# Patient Record
Sex: Female | Born: 1955 | Race: White | Hispanic: No | Smoking: Former smoker
Health system: Southern US, Community
[De-identification: ages and names within clinical notes are randomized; demographics above are authoritative.]

## PROBLEM LIST (undated history)

## (undated) DIAGNOSIS — K219 Gastro-esophageal reflux disease without esophagitis: Secondary | ICD-10-CM

## (undated) DIAGNOSIS — K589 Irritable bowel syndrome without diarrhea: Secondary | ICD-10-CM

## (undated) DIAGNOSIS — F411 Generalized anxiety disorder: Secondary | ICD-10-CM

## (undated) DIAGNOSIS — K5900 Constipation, unspecified: Secondary | ICD-10-CM

## (undated) DIAGNOSIS — I2510 Atherosclerotic heart disease of native coronary artery without angina pectoris: Secondary | ICD-10-CM

## (undated) DIAGNOSIS — IMO0002 Reserved for concepts with insufficient information to code with codable children: Secondary | ICD-10-CM

## (undated) DIAGNOSIS — R634 Abnormal weight loss: Secondary | ICD-10-CM

## (undated) DIAGNOSIS — N951 Menopausal and female climacteric states: Secondary | ICD-10-CM

## (undated) DIAGNOSIS — K635 Polyp of colon: Secondary | ICD-10-CM

## (undated) DIAGNOSIS — M159 Polyosteoarthritis, unspecified: Secondary | ICD-10-CM

## (undated) DIAGNOSIS — N318 Other neuromuscular dysfunction of bladder: Secondary | ICD-10-CM

## (undated) DIAGNOSIS — F329 Major depressive disorder, single episode, unspecified: Secondary | ICD-10-CM

## (undated) DIAGNOSIS — J449 Chronic obstructive pulmonary disease, unspecified: Secondary | ICD-10-CM

## (undated) DIAGNOSIS — F3289 Other specified depressive episodes: Secondary | ICD-10-CM

## (undated) DIAGNOSIS — R110 Nausea: Secondary | ICD-10-CM

## (undated) DIAGNOSIS — J189 Pneumonia, unspecified organism: Secondary | ICD-10-CM

## (undated) DIAGNOSIS — J4489 Other specified chronic obstructive pulmonary disease: Secondary | ICD-10-CM

## (undated) DIAGNOSIS — M1990 Unspecified osteoarthritis, unspecified site: Secondary | ICD-10-CM

## (undated) DIAGNOSIS — M329 Systemic lupus erythematosus, unspecified: Secondary | ICD-10-CM

## (undated) DIAGNOSIS — R609 Edema, unspecified: Secondary | ICD-10-CM

## (undated) DIAGNOSIS — M797 Fibromyalgia: Secondary | ICD-10-CM

## (undated) DIAGNOSIS — J45909 Unspecified asthma, uncomplicated: Secondary | ICD-10-CM

## (undated) DIAGNOSIS — G4700 Insomnia, unspecified: Secondary | ICD-10-CM

## (undated) HISTORY — DX: Irritable bowel syndrome, unspecified: K58.9

## (undated) HISTORY — DX: Other specified chronic obstructive pulmonary disease: J44.89

## (undated) HISTORY — PX: CEREBRAL ANEURYSM REPAIR: SHX164

## (undated) HISTORY — DX: Polyp of colon: K63.5

## (undated) HISTORY — DX: Atherosclerotic heart disease of native coronary artery without angina pectoris: I25.10

## (undated) HISTORY — DX: Insomnia, unspecified: G47.00

## (undated) HISTORY — DX: Unspecified asthma, uncomplicated: J45.909

## (undated) HISTORY — DX: Abnormal weight loss: R63.4

## (undated) HISTORY — DX: Edema, unspecified: R60.9

## (undated) HISTORY — DX: Pneumonia, unspecified organism: J18.9

## (undated) HISTORY — DX: Chronic obstructive pulmonary disease, unspecified: J44.9

## (undated) HISTORY — DX: Major depressive disorder, single episode, unspecified: F32.9

## (undated) HISTORY — DX: Fibromyalgia: M79.7

## (undated) HISTORY — DX: Menopausal and female climacteric states: N95.1

## (undated) HISTORY — PX: BREAST LUMPECTOMY: SHX2

## (undated) HISTORY — DX: Polyosteoarthritis, unspecified: M15.9

## (undated) HISTORY — DX: Reserved for concepts with insufficient information to code with codable children: IMO0002

## (undated) HISTORY — PX: LUMBAR DISC SURGERY: SHX700

## (undated) HISTORY — DX: Gastro-esophageal reflux disease without esophagitis: K21.9

## (undated) HISTORY — DX: Unspecified osteoarthritis, unspecified site: M19.90

## (undated) HISTORY — DX: Systemic lupus erythematosus, unspecified: M32.9

## (undated) HISTORY — DX: Other specified depressive episodes: F32.89

## (undated) HISTORY — DX: Other neuromuscular dysfunction of bladder: N31.8

## (undated) HISTORY — DX: Generalized anxiety disorder: F41.1

## (undated) HISTORY — DX: Constipation, unspecified: K59.00

## (undated) HISTORY — DX: Nausea: R11.0

---
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
## 2001-02-27 ENCOUNTER — Inpatient Hospital Stay (HOSPITAL_COMMUNITY): Admission: EM | Admit: 2001-02-27 | Discharge: 2001-03-12 | Payer: Self-pay | Admitting: *Deleted

## 2001-03-07 ENCOUNTER — Encounter: Payer: Self-pay | Admitting: Emergency Medicine

## 2001-03-08 ENCOUNTER — Encounter: Payer: Self-pay | Admitting: *Deleted

## 2001-06-17 ENCOUNTER — Inpatient Hospital Stay (HOSPITAL_COMMUNITY): Admission: EM | Admit: 2001-06-17 | Discharge: 2001-06-20 | Payer: Self-pay | Admitting: Psychiatry

## 2006-10-05 ENCOUNTER — Encounter: Admission: RE | Admit: 2006-10-05 | Discharge: 2006-10-05 | Payer: Self-pay | Admitting: Pediatrics

## 2006-10-10 ENCOUNTER — Ambulatory Visit (HOSPITAL_COMMUNITY): Admission: RE | Admit: 2006-10-10 | Discharge: 2006-10-10 | Payer: Self-pay | Admitting: Pediatrics

## 2006-11-27 ENCOUNTER — Encounter: Admission: RE | Admit: 2006-11-27 | Discharge: 2006-11-27 | Payer: Self-pay | Admitting: Radiology

## 2006-12-12 ENCOUNTER — Observation Stay (HOSPITAL_COMMUNITY): Admission: AD | Admit: 2006-12-12 | Discharge: 2006-12-13 | Payer: Self-pay | Admitting: Radiology

## 2006-12-27 ENCOUNTER — Encounter: Admission: RE | Admit: 2006-12-27 | Discharge: 2006-12-27 | Payer: Self-pay | Admitting: Interventional Radiology

## 2007-01-02 ENCOUNTER — Encounter: Admission: RE | Admit: 2007-01-02 | Discharge: 2007-01-02 | Payer: Self-pay | Admitting: Radiology

## 2007-04-20 ENCOUNTER — Encounter: Admission: RE | Admit: 2007-04-20 | Discharge: 2007-04-20 | Payer: Self-pay | Admitting: Radiology

## 2007-05-01 ENCOUNTER — Ambulatory Visit (HOSPITAL_COMMUNITY): Admission: RE | Admit: 2007-05-01 | Discharge: 2007-05-01 | Payer: Self-pay | Admitting: Radiology

## 2007-05-01 ENCOUNTER — Encounter (INDEPENDENT_AMBULATORY_CARE_PROVIDER_SITE_OTHER): Payer: Self-pay | Admitting: Radiology

## 2007-05-01 ENCOUNTER — Ambulatory Visit: Payer: Self-pay | Admitting: Vascular Surgery

## 2007-05-05 ENCOUNTER — Encounter: Admission: RE | Admit: 2007-05-05 | Discharge: 2007-05-05 | Payer: Self-pay | Admitting: Radiology

## 2010-10-16 ENCOUNTER — Encounter: Payer: Self-pay | Admitting: Radiology

## 2010-10-16 ENCOUNTER — Encounter: Payer: Self-pay | Admitting: Pediatrics

## 2011-02-10 NOTE — Discharge Summary (Signed)
[facility]  Patient:    Breanna Hughes, Breanna Hughes Visit Number: [id number] MRN: [id number]          Service Type: PSY Location: 50 [id number] Attending Physician:  Jeanice Lim Dictated by:   Jeanice Lim, M.D. Admit Date:  06/17/2001 Discharge Date: 06/20/2001                             Discharge Summary  IDENTIFYING DATA:  This is a 55 year old married Caucasian female voluntarily admitted for depression and suicidal ideation with a history of depression last hospitalization in June 2002, which was her first hospitalization.  She sees Dr. Worthy Flank and follows up at the [facility].  MEDICATIONS:  1. Albuterol.  2. Premarin.  3. Levsin.  4. Keppra 1000 mg b.i.d.  5. Zyprexa 5 mg q.h.s.  6. Prozac 40 mg q.a.m. and 40 mg q.12h. p.m.  7. Desyrel 150 mg q.h.s.  8. Klonopin 1 mg q.i.d.  9. Vioxx 12.5 mg q.12 p.m. and 25 mg q.h.s. 10. Lisinopril 20 mg b.i.d.  DRUG ALLERGIES:  NUBAIN.  PHYSICAL EXAMINATION:  Essentially within normal limits.  VITAL SIGNS:  Stable.  The patient is afebrile.  GENERAL:  No acute distress.  NEUROLOGIC:  Grossly nonfocal.  LABORATORY DATA:  WBC was elevated at 10.7, neutrophils 81, potassium was low at 2.8.  She received 40 mEq of potassium while in the emergency room and urine drug screen was positive for opiates.  MENTAL STATUS EXAMINATION:  The patient was alert and oriented x3 with multiple rings on her fingers and several ear piercings, dressed casually. Speech was within normal limits.  Mood was depressed.  Affect flat.  Thought processes goal direct.  Thought content negative for psychotic symptoms.  No suicidal or homicidal or violent ideation after being admitted in the hospital.  Cognition was intact.  Judgment and insight were considered fair.  ADMITTING DIAGNOSES: Axis I:    Major depression, recurrent. Axis II:   None. Axis III:  Epilepsy, asthma, hiatal hernia, and  fibromyalgia. Axis IV:   Moderate to severe problems with primary support and medical            problems. Axis V:    Current 30, highest in the past year 70.  HOSPITAL COURSE:  The patient was ordered for routine p.r.n. medications and restarted on medications, encouraged to drink Gatorade to replenish potassium and ordered K-Dur 10 mEq b.i.d. and Vicodin 1-2 q.4-6h. p.r.n. severe pain. Zyprexa was optimized 15 mg q.h.s. and the patient was discontinued off Vicodin and given Ultram which she reported a positive response to due to the risk of Vicodin worsening her mood which is the primary reason for admission associated with suicidal ideation.  CONDITION ON DISCHARGE:  Improved, but not recovered.  Her mood was more stable, less depressed affect, brighter.  Thought processes were goal directed.  Thought content negative for suicidal or homicidal ideation or violent ideation.  There were no psychotic symptoms.  Judgment and insight were improved.  DISPOSITION:  She reported motivation to be compliant with follow-up plans and was discharged with prescriptions for Prozac 40 mg q.a.m. and q.12h. p.m., Zyprexa 15 mg q.h.s., Klonopin 1 mg q.i.d., trazodone 150 mg q.h.s., Keppra 500 mg 2 q.a.m. and 2 q.h.s., Nexium 20 mg b.i.d., Premarin 1.25 mg q.a.m., Zyrtec b.i.d., Vioxx 12.5 q.12h. and 2 q.h.s.  She was given a one weeks supply of psychiatric medicines and  encouraged to keep her follow-up appointment on June 26, 2001, at 9:30 in the morning and follow-up with [facility], Dr. Cheree Ditto, on Wednesday, October 2.  She was advised to return to the ER if the stressor side effects occur and to follow up with her primary care physician regarding her multiple medical conditions.  DISCHARGE DIAGNOSES: Axis I:    Major depression, recurrent. Axis II:   None. Axis III:  Epilepsy, asthma, hiatal hernia, and fibromyalgia. Axis IV:   Moderate to severe problems  with primary support and medical            problems. Axis V:    On discharge 55. Dictated by:   Jeanice Lim, M.D. Attending Physician:  Jeanice Lim DD:  07/23/01 TD:  07/24/01 Job: [id number]

## 2011-02-10 NOTE — Discharge Summary (Signed)
[facility]  Patient:    Breanna Hughes, Breanna Hughes                         MRN: [id number] Adm. Date:  [id number] Disc. Date: [id number] Attending:  Hipolito Bayley J                           Discharge Summary  INTRODUCTION:  Breanna Hughes is a 55 year old married white female, who was admitted voluntarily to service of Dr. Katrinka Blazing, being transferred from emergency room of [facility] after overdose on multiple drugs.  At the time of admission to emergency room, she was confused, lethargic and hypoxic.  Patient did not remember events leading to admission.  She denies any overdosing or intent of suicidal attempt or taking extra medication.  It was found that she apparently was experiencing auditory hallucinations and delusional behavior. She was feeling like baby was in her stomach, seeing animals outside of her apartment and around her.  The patient denied depressive symptoms.  Patient denied suicidal attempt but had some depressive symptoms.  Denies any previous suicidal attempt, homicidal ideation, irritability.  She mentioned sleeping poorly and having problems with appetite and losing significant amount of weight.  PAST MEDICAL HISTORY:  Medically, was under care of Dr. Haynes Hoehn from [place]. Recently, she was discovered as having seizures, which was diagnosed in August of 2001 and was seen by Dr. Adella Hare at [facility].  She also suffers from fibromyalgia, PTSD and bulimia.  MEDICATIONS:  Prior to admission, prednisone, Z-Pak, Prevacid, Keppra, Prozac, Zyrtec, Vioxx, BuSpar, Klonopin and Darvocet as well as Vistaril.  The details of patients admission situation are available in admission note.  HOSPITAL COURSE:  As mentioned upon admission, she was placed in service of Dr. Milford Cage.  She was restarted on prednisone, Prevacid, Keppra, Prozac 60 mg daily, Zyrtec, Vioxx, BuSpar 10 mg daily, Klonopin 0.5 mg three times p.r.n., albuterol and  Vistaril.  She was placed on special observation and was able to contract for safety.  For next few days, patient was observed as being significantly depressed, anxious with frequent somatization.  It seems like stress dealing with health problems affected her marriage.  Dr. Katrinka Blazing decided to increase Prozac to 80 mg daily and discontinue Klonopin.  For next few days, patient remained the same but anxiety was out of control, which prompted Dr. Katrinka Blazing to increased Ativan to four times a day, 1 mg.  At this time also, she decided to increase patients Keppra to 1000 mg twice a day.  It was congruent with information received from patients neurologist.  On next few days, there were no significant change in patient mood.  She continued to be disorganized, somewhat confused, often sedated.  She continued being anxious but psychosis seemed to be slowly clearing.  On March 06, 2001, Dr. Katrinka Blazing noted that she was still with labile mood, affect depressed.  At the time, because of excessive sedation, Dr. Katrinka Blazing decided to stop Darvocet, Phenergan and BuSpar.  On March 07, 2001, affect and mood became not much better, still irritable, sad.  On March 09, 2001, patient was seen by Dr. Dub Mikes.  Because of insomnia, Dr. Dub Mikes increased patients trazodone and replaced Ativan with Klonopin.  She was first seen by Dr. Lourdes Sledge on March 11, 2001.  She had history of previous [facility] admission for similar accidental overdose.  At the time of this examination, she was not  psychotic and denied any hallucinatory experiences, denies any dangerous ideations.  My diagnostic impression was major depression, bulimia and rule out somatization disorder.  Because of muscular pain, the Vioxx was increased to 12.5 mg at noon and 25 mg at h.s.  The patient demanded medication for pain excessively and was not happy since narcotic treatment was greatly reduced.  I talked to patients physician, Dr. Mayford Knife, who was following  Breanna Hughes while in inpatient service.  Because of current abdominal pain, she was seen several times in the emergency room, had extensive workup and Dr. Mayford Knife felt there is no need for anymore intervention.  The patient was seen briefly by Dr. Jamie Brookes on March 12, 2001, whose impression was chronic abdominal pain with low-grade fever, leukocytosis without shift.  Rest of abdominal workup negative.  Symptoms of dysuria probably due to UTI and mild hypoglycemia.  Dr. Jamie Brookes agreed that patient could be discharged home from medical point of view with idea of abdominal and pelvics can be done on outpatient basis.  He decided to start patient on antibiotic, Cipro, for five days.  From psychiatric point of view, Ms. Michail Jewels was doing well, showing somehow detached and inappropriately bright affect considering complaints of seriousness of her somatic problems.  Treatment team felt that she reached maximum benefits from hospitalization and should be discharged home for further outpatient treatment with medical components done in the office of her family physician.  OTHER MEDICAL PROBLEMS:  As mentioned, patient was checked several times at emergency room and had medical consultations done.  Significant abnormalities showed increase in T3 uptake with normal TSH and slightly low T4.  On further observation, increased white blood cells to 16,000 on June 15 and going down to close 15,000 on June 18.  There was some left shift.  Chest x-ray showed bronchitis bilaterally with prominence on the left with possibility of mild mid lung infiltration too, clinically interpreted by medicine. As mentioned, patient also had abdominal ultrasounds and several other blood tests which were basically negative.  Vital signs throughout hospitalization were stable with blood pressure fluctuating between 120/80 to 150/90 with exception of few incidents of when temperature rose to 99 at one point and then 101  degree on March 08, 2001.  She was running a temperature between 97.2 and 99.  Other vital signs were normal.  DIAGNOSTIC IMPRESSION: Axis I:    1. Major depression, recurrent, moderate to severe.            2. Bulimia by history.             3. Rule out somatization disorder. 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 Axis III:  1. Fibromyalgia.            2. Chronic abdominal pain. Axis IV:   Moderate stressors (family circumstances, health problems). Axis V:    Global Assessment of Functioning at the time of admission 35;            maximum for past year estimated at 65; upon discharge between            55-60.  DISCHARGE MEDICATIONS: 1. Prozac 80 mg q.d. 2. Trazodone 150 mg q.h.s. 3. Klonopin 1 mg q.i.d. 4. Vioxx 25 mg, 1/2 at noon and 1 q.h.s. 5. Albuterol inhaler up to q.i.d. 6. Atarax p.r.n. for anxiety 25 mg. 7. Nexium 40 mg q.d. 8. Keppra 500 mg, 2  tablets b.i.d. x 5 days. 9. Premarin 1.25 mg q.d.  DISCHARGE RECOMMENDATIONS:  The patients husband was advised to secure her medication and dispense them on daily basis.  The patient has appointment with Dr. Haynes Hoehn, primary doctor, on March 13, 2001 at 2 p.m., next day, and with [facility] on Wednesday, March 20, 2001 at 8:30 a.m. The patient, at the time of discharge, did not experience any side effects from medication and, in good condition, was discharged home. DD:  04/24/01 TD:  04/27/01 Job: [id number]

## 2011-02-10 NOTE — Discharge Summary (Signed)
Breanna Hughes, Breanna Hughes               ACCOUNT NO.:  [id number]   MEDICAL RECORD NO.:  [id number]          PATIENT TYPE:  INP   LOCATION:  2316                         FACILITY:  [facility]   PHYSICIAN:  Marin Roberts, MDDATE OF BIRTH:  02/02/1956   DATE OF ADMISSION:  12/12/2006  DATE OF DISCHARGE:  12/13/2006                               DISCHARGE SUMMARY   CHIEF COMPLAINT:  Cerebral aneurysm.   HISTORY OF PRESENT ILLNESS:  This is a pleasant, 55 year old female was  referred to Dr. Marin Roberts via the courtesy of Dr. Rexford Maus, an ear, nose, and throat physician, in [place].  The patient  has a history of right TMJ problems.  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  Dr. Alfredo Batty saw the patient in  consultation on January 11.  The patient had a cerebral angiogram  performed on October 10, 2006, that confirmed a 10.5 x 6.5 mm anterior  communicating artery aneurysm.  The patient was admitted to [facility] on December 12, 2006, for possible treatment of the aneurysm.  Please see the dictated admission history and physical for full details.   PAST MEDICAL HISTORY:  Significant for COPD with ongoing tobacco use, a  history of anxiety and depression, chronic neck and back pain, a history  of lupus, and the above noted cerebral aneurysm.  The patient had a  recent respiratory tract infection.  She had a question of an infection  in her TMJ area.  This was treated with antibiotics as was a respiratory  infection.  She had a recent fractured left 6th rib as a result of a  fall.  Again, please see the dictated history and physical for full  details.   ALLERGIES:  NUBAIN CAUSES ANAPHYLAXIS.  PREDNISONE CAUSES IRRITABILITY.  DARVOCET CAUSED HER TO SLEEPWALK.   MEDICATIONS AT TIME OF ADMISSION:  1. Hydrocodone cough syrup.  2. Fentanyl patches.  3. Combivent nebulizers.  4. Premarin 1.25 mg daily.  5. Plaquenil 200 mg 1-1/2 tablets daily.  6. Lasix 20 mg p.r.n.  7. Trazodone 50 mg 2-3 tablets at bedtime.  8. Promethazine 25 mg p.r.n.  9. Sertraline 100 mg 2 tablets daily.  10.Neurontin 800 mg 2 twice daily.  11.Combivent metered dose inhaler p.r.n.  12.Vicodin p.r.n.  13.Nicotine patches daily.   SURGICAL HISTORY:  Significant for an appendectomy, sinus surgery,  hysterectomy, tonsillectomy, and lumbar spine surgery.  She states that  she has had previous complications with anesthesia which has caused her  to be nauseated and to act like a crazy person.   SOCIAL HISTORY:  The patient lives in [place] with her husband, and  they have 2 grown children.  She continues to smoke a pack of cigarettes  per day.  She has a 60-70 pack year history.  She has been smoking for  nearly 40 years.  She is unemployed.  She denies alcohol use.   FAMILY HISTORY:  Her mother died in her 4s from breast cancer.  Father  is alive in his 44s but has severe COPD.   HOSPITAL COURSE:  As noted, this patient was admitted in [facility] for treatment of a large anterior communicating artery aneurysm  that was an incidental finding on a CT scan performed to evaluate TMJ  problems.  On the day of admission, the patient underwent coiling of the  aneurysm performed by Dr. Alfredo Batty under general anesthesia.  There were  no obvious or immediate complications.  Please see his dictated report  for full details of the intervention.   Following the procedure, the patient was taken to the neuro PACU and  then later admitted to the Intensive Care Unit for close monitoring.  The patient did complain of a severe headache following the procedure.  A CT scan was performed that was negative for any acute findings.  The  following day the patient was ambulating.  The plan is to proceed with  discharge if she remains stable.   LABORATORY DATA:  A CBC on the day of discharge reveals hemoglobin 13.2,  hematocrit 38.3, WBC 7.8 thousand, platelets  269,000.  Her INR is 1.  A  PTT is 30.  A chemistry profile revealed BUN of 7, creatinine 0.74.  GFR  was greater than 60.  Potassium was 4.2.  Glucose was 112.  Calcium 8.3.  The remainder was within normal limits.   DISCHARGE MEDICATIONS:  The patient was told to continue on her home  medications.  She was also to take an 81 mg aspirin each day as well as  Plavix 75 mg daily for 3 months.   The patient was told to limit her activity for the next 2 weeks.  She  was not to drive.  She was to avoid anything strenuous.  She was not to  lift more than 20 pounds.  She was given instructions regarding wound  care.   The patient will follow up with Dr. Alfredo Batty in approximately 2 weeks in  the clinic for further evaluation.  The office will call her to schedule  this appointment.   PROBLEM LIST AT TIME OF DISCHARGE:  1. 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  2. Status post coiling of the aneurysm performed by Dr. Alfredo Batty December 12, 2006, under general anesthesia.  3. History of chronic obstructive pulmonary disease with ongoing      tobacco use despite recommendations to quit.  4. Chronic neck and back pain.  5. History of anxiety and depression.  6. History of lupus.  7. Frequent headaches.  8. Recent left rib fracture secondary to a fall.  9. Allergies or intolerance to Nubain, prednisone, and Darvocet.  10.Status post multiple surgeries as listed above.  11.History of fibromyalgia.  12.Hiatal hernia and gastroesophageal reflux disease.  13.Recent right temporomandibular joint problems, status post      antibiotic therapy for possible infection.  14.Recent respiratory infection treated with antibiotics.      Delton See, P.A.      Marin Roberts, MD  Electronically Signed    DR/MEDQ  D:  12/13/2006  T:  12/13/2006  Job:  [id number]   cc:   Beverely Risen, M.D.  Danae Orleans. Venetia Maxon, M.D.

## 2011-02-10 NOTE — H&P (Signed)
Breanna Hughes, Breanna Hughes               ACCOUNT NO.:  [id number]   MEDICAL RECORD NO.:  [id number]          PATIENT TYPE:  AMB   LOCATION:  SDS                          FACILITY:  [facility]   PHYSICIAN:  Marin Roberts, MDDATE OF BIRTH:  Mar 02, 1956   DATE OF ADMISSION:  12/12/2006  DATE OF DISCHARGE:                              HISTORY & PHYSICAL   CHIEF COMPLAINT:  Cerebral aneurysm.   HISTORY OF PRESENT ILLNESS:  This is a pleasant, 55 year old female who  was referred to Dr. Marin Roberts through the courtesy of Dr.  Rexford Maus an ears, nose, and throat physician in [place].  The  patient has a history of right TMJ problems.  She had a CT scan  performed, which incidentally noted an anterior communicating artery  aneurysm.  The patient was felt to have an infection of her parotid or  possibly in the TM joint itself; this was treated with a course of  clindamycin.  The patient saw Dr. Alfredo Batty in consultation on October 05, 2006.  He reviewed the patient's CT scan and discussed treatment options  for her aneurysm.  A cerebral angiogram was recommended for further  evaluation.  The angiogram was performed on October 10, 2006 by Dr.  Alfredo Batty.  This confirmed a 10.5 x 6.5 mm anterior communicating artery  aneurysm.  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   The patient returned to see Dr. Peri Jefferson on November 27, 2006.  She had been  scheduled for her aneurysm coiling on 11/28/2006, but due to an upper  respiratory tract infection, this was rescheduled to 12/12/2006.  The  patient was treated with erythromycin for her respiratory infection.   When the patient presented for her preoperative evaluation, she reported  that she had recently fallen and injured her ribs.  A chest x-ray was  performed along with rib films that  showed a fracture of the left sixth  rib with some atelectasis on chest x-ray.  The patient reported pain  with deep inspiration and cough.  Dr. Alfredo Batty considered, once again,  her intervention; however, the patient was contacted at home, the day  prior to admission and she reported that she was doing better and wanted  to proceed.  She presents today, accompanied by her husband to be  admitted for possible coiling of her cerebral aneurysm to be performed  by Dr. Wallie Char.   PAST MEDICAL HISTORY:  The patient has no history of hypertension or  coronary artery disease.  She does have a history of depression, with a  previous admission for suicidal ideations in 2002.  She has COPD and  continues to smoke.  Dr. Alfredo Batty had counseled her regarding, quitting  smoking; however, the patient did not feel that she could accomplish  this at this time.  She has chronic neck and back pain.  She has a  history of lupus.  She has the above noted cerebral aneurysm.   ALLERGIES:  She reports allergies to NUBAIN which caused anaphylaxis,  PREDNISONE caused irritability, and DARVOCET caused her to sleep walk.   CURRENT MEDICATIONS INCLUDE:  1. Hydrocodone cough syrup.  2. Fentanyl patch 2 every three days.  3. Combivent nebulizers p.r.n.  4. Premarin 1.25 mg daily.  5. Plaquenil 200 mg 1-1/2 tablets daily.  6. Lasix 20 mg daily p.r.n.  7. Trazodone 50 mg 2-3 tablets at bedtime.  8. Promethazine 25 mg p.r.n.  9. Sertraline 100 mg 2 tablets daily.  10.Neurontin 800 mg 2 twice daily.  11.Combivent metered-dose inhaler p.r.n.  12.Vicodin p.r.n.  13.Nicotine patch daily.   SURGICAL HISTORY:  Significant for an appendectomy, sinus surgery, a  hysterectomy, tonsillectomy, and lumbar spine surgery.  She states that  she has had previous complications with anesthesia causing her to be  nauseated or causing her to act like a crazy person.   SOCIAL HISTORY:  The patient lives with her husband in [place],  [place].  They have 2 grown children.  She continues to smoke 1  pack per day.  She has a 60-70-pack-year-history.  She has been smoking  for nearly 40 years.  She is unemployed.  She denies alcohol use.   FAMILY HISTORY:  Her mother died in her 58s from breast cancer.  Her  father is alive in his 34s but he has severe COPD.   REVIEW OF SYSTEMS:  Negative except for the following:  She has a  chronic cough productive of clear sputum which attributes to allergies.  She has frequent headaches and reported a headache on the a.m. of  admission.  She has been constipated recently.  She has had some  dysuria.  She has had rib pain due to her recent fracture.  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  She has a history of lupus.  She bruises  easily. She reports being very anxious on the morning of admission.  She  has gastroesophageal reflux disease, urinary frequency, fibromyalgia.  She reports a muffled sound in her right ear.  She has had difficulty  with sleep.   LABORATORY DATA:  An INR is 0.9; PT was 12; PTT was 27. Hemoglobin 13.7,  hematocrit 40.2, WBC 9200, platelets are 318,000.  BUN 9, creatinine  0.61, potassium 3.8, glucose is 80.  GFR was greater than 60.   PHYSICAL EXAMINATION:  GENERAL:  Physical exam reveals a very anxious 4-  year-old Caucasian female in no acute distress.  VITAL SIGNS:  Blood pressure 111/84, pulse 92, respirations 16,  temperature 97.3, oxygen saturation 97% on room air.  HEENT:  Unremarkable.  NECK:  Revealed no bruits.  HEART:  Revealed regular rate and rhythm with somewhat distant heart  sounds.  There was no murmur appreciated.  LUNGS:  Clear.  ABDOMEN:  Soft and nontender.  EXTREMITIES:  Revealed the pulses to be weak with trace edema.  Her  airway was rated at a 3.  Her ASA scale was rated at a 2.  NEUROLOGICAL EXAM:  Mental status--the patient was alert and oriented and follows commands. Cranial nerves II-XII are grossly intact.   Sensation was intact to light touch.  Motor strength was 5/5 throughout.  Cerebellar testing was intact.   IMPRESSION:  1. 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  The aneurysm      measures 10.5 x 6.5 mm.  2. History of chronic obstructive pulmonary disease  with ongoing      tobacco use despite recommendations to quit.  3. Chronic neck and back pain.  4. History of anxiety and depression.  5. History of lupus.  6. Frequent headaches.  7. Recent left rib fracture secondary to a fall.  8. Allergies/intolerance to NUBAIN, PREDNISONE, and DARVOCET.  9. Status post multiple surgeries as listed above.  10.Fibromyalgia.  11.Hiatal hernia and gastroesophageal reflux disease.  12.Urinary frequency and dysuria, question urinary tract infection.  13.History of right temporomandibular joint problems, status post      course of antibiotic therapy for possible infection.      Delton See, P.A.      Marin Roberts, MD  Electronically Signed    DR/MEDQ  D:  12/12/2006  T:  12/12/2006  Job:  [id number]   cc:   Susa Simmonds  [address] [place]  [place]  [address] Dr. Mariel Sleet. Venetia Maxon, M.D.

## 2011-02-10 NOTE — H&P (Signed)
[facility]  Patient:    Breanna Hughes, Breanna Hughes                         MRN: [id number] Adm. Date:  [id number] Attending:  Jasmine Pang Dictator:   Candi Leash. Theressa Stamps, N.P.                   Psychiatric Admission Assessment  PATIENT IDENTIFICATION:  This is a 55 year old married white female voluntarily admitted as a transfer from [facility] after the patient was medically cleared for possible drug overdose.  HISTORY OF PRESENT ILLNESS:  The patient was initially admitted to [facility] on February 27, 2001, with a possible multiple drug overdose.  The patient was confused, having extreme lethargy and hypoxia.  The patient did have three ER visits prior to this admission for nervousness and itching.  The patient was prescribed Vistaril and Atarax.  The patient does not recall the events leading to this admission.  She denied any overdosing with the intent of a suicide attempt or taking extra medications.  The patient apparently was experiencing auditory hallucinations, delusional behavior, feeling like a baby was in her stomach, and visual hallucinations, seeing a cat.  The patient does report some depressive symptoms but denies any suicide attempt, homicidal ideation, irritability, reports some anxiety.  She reports she has been sleeping poorly.  Her weight does fluctuate, she has a history of bulimia. She did state she has lost 60 pounds and then gained 30 back.  The patient feels that she is here to have a medical workup.  PAST PSYCHIATRIC HISTORY:  This is her first hospitalization.  She has had no recent outpatient treatment.  She did see a therapist in the past.  SUBSTANCE ABUSE HISTORY:  She has smoked one pack a day for years.  She denies any alcohol use.  She reports that it was a problem in the past; apparently she stopped about a year ago.  Denies any illicit drug use.  PAST MEDICAL HISTORY:  Primary care Nidya Bouyer is Dr. Miguel Rota  in [place].  Medical problems: Seizures recently diagnosed in August 2001.  The patient reports she had an extensive workup.  She sees Dr. Adella Hare at [facility].  Other medical problems: Fibromyalgia, PTSD; bulimia, the patient reports this is active; she does not intend to vomit after she eats but she does state she vomits with each meal.  She also reports her liver enzymes were elevated recently and was recently jaundiced about three to four days ago.  The patient reports she has had problems with itching, has been to the emergency room two to three times in regard to that.  Current list of medications: Prednisone 20 mg t.i.d. p.o., Z-Pak as directed, Prevacid 30 mg p.o. every day, Keppra 500 mg two p.o. b.i.d., Prozac 50 mg p.o. q.d., Zyrtec 10 mg one q.d., Vioxx 25 mg p.o. q.d., BuSpar 10 mg p.o. q.d., ______ 20 mg p.o. q.d., Klonopin 0.5 mg p.o. t.i.d., Darvocet-N 100 one p.o. q.4-6h., Vistaril 25 mg p.o. b.i.d., albuterol two puffs q.6h. p.r.n., Premarin 1.25 mg p.o. q.d.  Drug allergies: NUBAIN.  Physical examination was performed while the patient was admitted at [facility].  The patient does have a temperature with last vital signs of 100.2, heart rate 104, respiratory rate 20, blood pressure 125/95.  Urine drug screen at [facility] was positive for opiates, benzodiazepines, tricyclics, and barbiturates.  The patient is currently complaining of some right  upper quadrant pain.  SOCIAL HISTORY:  A 55 year old married white female, married for 12 years. She has two children ages 60 and 57.  She lives with her husband.  She is a housewife.  She cares for her grandchildren.  She has completed 14 years of schooling.  No legal problems.  Family history: Mother, father, and brothers all have problems with depression.  MENTAL STATUS EXAMINATION:  Alert, young middle-aged Caucasian female dressed in nightwear, several piercings to her right ear.  Cooperative with fair  eye contact.  Speech is normal pace and tone and relevant. DD:  02/28/01 TD:  02/28/01 Job: [id number]
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
## 2011-02-10 NOTE — H&P (Signed)
[facility]  Patient:    Breanna Hughes, Breanna Hughes                         MRN: [id number] Adm. Date:  [id number] Attending:  Jasmine Pang Dictator:   Candi Leash. Orsini, N.P.                   Psychiatric Admission Assessment  MENTAL STATUS EXAMINATION:  Speech is normal pace and tone, relevant.  The patient does focus on a lot of somatic complaints.  Mood is depressed.  Affect is blunt.  Thought processes: Currently denying any auditory or visual hallucinations, no suicidal or homicidal ideations, no paranoia, no flight of ideas.  Cognitive: Disoriented to time and date.  Memory is poor.  Judgment is poor.  Insight is poor.  Poor historian.  The patient does think she is here for a medical workup and is very vague in regard to her admission to [facility].  ADMISSION DIAGNOSES: Axis I:    1. Major depression.            2. Bulimia. Axis II:   Deferred. Axis III:  1. Seizure disorder.            2. Fibromyalgia.            3. Asthma. Axis IV:   Moderate problems relating to primary support group, medical            problems. Axis V:    Current is 35, this past year estimated 65.  INITIAL PLAN OF CARE:  Involuntary admission for continued evaluation for her depression, possible overdose, and psychosis.  Will resume her routine medications.  The patient is to be checked every 15 minutes and contracts for safety.  Will contact husband for background information, medical problems, and clarification of her medication.  Will also contact her neurologist for clarification of her seizure medications.  Plan is to return the patient to her prior living arrangement and to improve her mood and thinking so the patient can be safe.  ESTIMATED LENGTH OF STAY:  Three to five days. DD:  02/28/01 TD:  02/28/01 Job: [id number]

## 2011-07-10 LAB — CBC
HCT: 44.1
MCV: 93.4
Platelets: 235
WBC: 8.6

## 2011-07-10 LAB — BASIC METABOLIC PANEL
BUN: 9
CO2: 27
Chloride: 104
Potassium: 4.3

## 2014-02-19 ENCOUNTER — Encounter (INDEPENDENT_AMBULATORY_CARE_PROVIDER_SITE_OTHER): Payer: Self-pay

## 2014-02-19 ENCOUNTER — Ambulatory Visit (INDEPENDENT_AMBULATORY_CARE_PROVIDER_SITE_OTHER): Payer: BC Managed Care – PPO | Admitting: Internal Medicine

## 2014-02-19 ENCOUNTER — Ambulatory Visit (INDEPENDENT_AMBULATORY_CARE_PROVIDER_SITE_OTHER)
Admission: RE | Admit: 2014-02-19 | Discharge: 2014-02-19 | Disposition: A | Discharge status: Home / Self Care | Payer: BC Managed Care – PPO | Source: Ambulatory Visit | Attending: Internal Medicine | Admitting: Internal Medicine

## 2014-02-19 ENCOUNTER — Encounter: Payer: Self-pay | Admitting: Internal Medicine

## 2014-02-19 VITALS — BP 102/56 | HR 82 | Ht 64.0 in | Wt 111.6 lb

## 2014-02-19 DIAGNOSIS — M329 Systemic lupus erythematosus, unspecified: Secondary | ICD-10-CM

## 2014-02-19 DIAGNOSIS — Z23 Encounter for immunization: Secondary | ICD-10-CM

## 2014-02-19 DIAGNOSIS — J449 Chronic obstructive pulmonary disease, unspecified: Secondary | ICD-10-CM

## 2014-02-19 MED ORDER — BUDESONIDE 180 MCG/ACT IN AEPB: 2.0000 | INHALATION_SPRAY | Freq: Two times a day (BID) | RESPIRATORY_TRACT | Status: DC

## 2014-02-19 MED ORDER — BECLOMETHASONE DIPROPIONATE 80 MCG/ACT IN AERS: 2.0000 | INHALATION_SPRAY | Freq: Two times a day (BID) | RESPIRATORY_TRACT | Status: DC

## 2014-02-19 MED ORDER — AEROCHAMBER MV MISC
Status: DC
Start: 2014-02-19 — End: 2014-02-19

## 2014-02-19 MED ORDER — AEROCHAMBER MV MISC: Status: DC

## 2014-02-19 NOTE — Progress Notes (Signed)
02/19/14- 80 yoF former smoker self referred for c/o COPD and chronic bronchitis complicated by hx cerebral artery aneurysm, depression Patient was diagnosed with COPD in the early 2000's. She quit smoking 4 years ago. There is easy dyspnea on exertion, worse in the past month, at least partly related to spring pollens. Little day-to-day change. Wakes with sternal soreness in the morning from work of breathing. Less cough since she moved from a molding house in January where she had been living for the past 10 years. Cough is aggravating her degenerative disc disease. Does wheeze. Nasal stuffiness and sniffing. Previous history of sinus polyp surgery without history of aspirin allergy. Previous pneumonia. Aware of GERD and sleeps with head elevated on pillow. Occasional right upper anterior chest pains are not exertional. Never hemoptysis. Combivent Respimat and Dulera inhalers seem to "burn".uses Ventolin rescue inhaler about once daily. For systemic lupus on Plaquenil. She had lost a lot of weight when husband died of pancreatic cancer, but weight is now stable  Prior to Admission medications   Medication Sig Start Date End Date Taking? Authorizing Provider  albuterol (VENTOLIN HFA) 108 (90 BASE) MCG/ACT inhaler Inhale 2 puffs into the lungs every 6 (six) hours as needed for wheezing or shortness of breath.   Yes Historical Provider, MD  clonazePAM (KLONOPIN) 0.5 MG tablet Take 0.5 mg by mouth every 8 (eight) hours as needed for anxiety.   Yes Historical Provider, MD  esomeprazole (NEXIUM) 40 MG capsule Take 40 mg by mouth daily at 12 noon.   Yes Historical Provider, MD  estrogen-methylTESTOSTERone (ESTRATEST) 1.25-2.5 MG per tablet Take 1 tablet by mouth daily.   Yes Historical Provider, MD  fentaNYL (DURAGESIC - DOSED MCG/HR) 100 MCG/HR Place 200 mcg onto the skin every 3 (three) days.   Yes Historical Provider, MD  hydrochlorothiazide (HYDRODIURIL) 25 MG tablet Take 25 mg by mouth daily.   Yes  Historical Provider, MD  HYDROcodone-acetaminophen (LORTAB) 7.5-500 MG per tablet Take 1 tablet by mouth every 6 (six) hours as needed for pain.   Yes Historical Provider, MD  HYDROcodone-homatropine (HYCODAN) 5-1.5 MG/5ML syrup Take 5 mLs by mouth every 6 (six) hours as needed for cough.   Yes Historical Provider, MD  hydroxychloroquine (PLAQUENIL) 200 MG tablet Take 300 mg by mouth daily.   Yes Historical Provider, MD  ondansetron (ZOFRAN-ODT) 8 MG disintegrating tablet Take 8 mg by mouth 2 (two) times daily as needed for nausea or vomiting.   Yes Historical Provider, MD  sertraline (ZOLOFT) 100 MG tablet Take 200 mg by mouth daily.   Yes Historical Provider, MD  traZODone (DESYREL) 50 MG tablet Take 4-6 tablets per night as needed   Yes Historical Provider, MD  beclomethasone (QVAR) 80 MCG/ACT inhaler Inhale 2 puffs into the lungs 2 (two) times daily. Rinse mouth well after use 02/19/14   Waymon Budge, MD  celecoxib (CELEBREX) 200 MG capsule Take 1 capsule by mouth daily. 03/31/14   Historical Provider, MD  LINZESS 145 MCG CAPS capsule Take 1 capsule by mouth daily. Prn constipation 04/04/14   Historical Provider, MD  Prenatal Vit-Fe Fumarate-FA (PRENATAL VITAMIN PO) Take 1 capsule by mouth daily.    Historical Provider, MD  Umeclidinium-Vilanterol (ANORO ELLIPTA) 62.5-25 MCG/INH AEPB Inhale 1 puff into the lungs daily. 04/07/14   Waymon Budge, MD   Past Medical History  Diagnosis Date  . Lupus   . Nausea alone   . Loss of weight   . Edema   . Generalized osteoarthrosis,  involving multiple sites   . Symptomatic menopausal or female climacteric states   . Hypertonicity of bladder   . Irritable bowel syndrome   . Unspecified constipation   . Esophageal reflux   . Chronic airway obstruction, not elsewhere classified   . Insomnia, unspecified   . Depressive disorder, not elsewhere classified   . Anxiety state, unspecified    Past Surgical History  Procedure Laterality Date  . Back  surgery    . Vesicovaginal fistula closure w/ tah  1982  . Breast lumpectomy      x 2  . Appendectomy  1976   Family History  Problem Relation Age of Onset  . Emphysema Father   . Allergies Brother   . Rheum arthritis Maternal Grandmother   . Allergies Mother   . Allergies Brother   . Allergies Brother   . Cancer Mother   . Cancer Cousin    History   Social History  . Marital Status: Widowed    Spouse Name: N/A    Number of Children: N/A  . Years of Education: N/A   Occupational History  . disabled    Social History Main Topics  . Smoking status: Former Smoker -- 30 years    Types: Cigarettes    Quit date: 09/25/2009  . Smokeless tobacco: Not on file  . Alcohol Use: No     Comment: quit 2005  . Drug Use: No  . Sexual Activity: Not on file   Other Topics Concern  . Not on file   Social History Narrative  . No narrative on file   ROS-see HPI Constitutional:   +  weight loss, night sweats, fevers, chills, fatigue, lassitude. HEENT:   No-  headaches, difficulty swallowing, tooth/dental problems, sore throat,       No-  sneezing, itching, ear ache, nasal congestion, post nasal drip,  CV:  No-   chest pain, orthopnea, PND, swelling in lower extremities, anasarca,                                  dizziness, palpitations Resp: +shortness of breath with exertion or at rest.              + productive cough,  No non-productive cough,  No- coughing up of blood.              No-   change in color of mucus.  No- wheezing.   Skin: No-   rash or lesions. GI:  No-   heartburn, indigestion, abdominal pain, nausea, vomiting, diarrhea,                 change in bowel habits, loss of appetite GU: No-   dysuria, change in color of urine, no urgency or frequency.  No- flank pain. MS:  + joint pain or swelling.  No- decreased range of motion.  No- back pain. Neuro-     nothing unusual Psych:  No- change in mood or affect. + depression + anxiety.  No memory loss.  OBJ- Physical  Exam General- Alert, Oriented, Affect-appropriate, Distress- none acute Skin- rash-none, lesions- none, excoriation- none Lymphadenopathy- none Head- atraumatic            Eyes- Gross vision intact, PERRLA, conjunctivae and secretions clear            Ears- Hearing, canals-normal            Nose- +stuffy, no-Septal  dev, mucus, polyps, erosion, perforation             Throat- Mallampati IV , mucosa clear , drainage- none, tonsils- atrophic Neck- flexible , trachea midline, no stridor , thyroid nl, carotid no bruit Chest - symmetrical excursion , unlabored           Heart/CV- RRR , no murmur , no gallop  , no rub, nl s1 s2                           - JVD- none , edema- none, stasis changes- none, varices- none           Lung- +distant/clear to P&A, wheeze- none, cough+Dry , dullness-none, rub- none           Chest wall-  Abd- tender-no, distended-no, bowel sounds-present, HSM- no Br/ Gen/ Rectal- Not done, not indicated Extrem- cyanosis- none, clubbing, none, atrophy- none, strength- nl Neuro- grossly intact to observation

## 2014-02-19 NOTE — Patient Instructions (Addendum)
Sample Qvar 80    2 puffs then rinse mouth well, twice daily  Ok to use the Ventolin/ albuterol rescue inhaler 2 puffs, up to 4 times daily, IF NEEDED  Order- CXR- dx COPD              Script for aerochamber to use with Qvar  Schedule PFT and 6 MWT     Pneumovax

## 2014-04-07 ENCOUNTER — Ambulatory Visit (INDEPENDENT_AMBULATORY_CARE_PROVIDER_SITE_OTHER): Payer: BC Managed Care – PPO | Admitting: Internal Medicine

## 2014-04-07 ENCOUNTER — Encounter: Payer: Self-pay | Admitting: Internal Medicine

## 2014-04-07 ENCOUNTER — Encounter (INDEPENDENT_AMBULATORY_CARE_PROVIDER_SITE_OTHER): Payer: BC Managed Care – PPO

## 2014-04-07 ENCOUNTER — Encounter (INDEPENDENT_AMBULATORY_CARE_PROVIDER_SITE_OTHER): Payer: Self-pay

## 2014-04-07 VITALS — BP 118/72 | HR 88 | Ht 64.0 in | Wt 114.0 lb

## 2014-04-07 DIAGNOSIS — J439 Emphysema, unspecified: Secondary | ICD-10-CM

## 2014-04-07 DIAGNOSIS — R0989 Other specified symptoms and signs involving the circulatory and respiratory systems: Secondary | ICD-10-CM

## 2014-04-07 DIAGNOSIS — J449 Chronic obstructive pulmonary disease, unspecified: Secondary | ICD-10-CM | POA: Insufficient documentation

## 2014-04-07 DIAGNOSIS — R0609 Other forms of dyspnea: Secondary | ICD-10-CM

## 2014-04-07 DIAGNOSIS — J438 Other emphysema: Secondary | ICD-10-CM

## 2014-04-07 DIAGNOSIS — R0600 Dyspnea, unspecified: Secondary | ICD-10-CM

## 2014-04-07 DIAGNOSIS — M329 Systemic lupus erythematosus, unspecified: Secondary | ICD-10-CM

## 2014-04-07 HISTORY — DX: Chronic obstructive pulmonary disease, unspecified: J44.9

## 2014-04-07 HISTORY — DX: Systemic lupus erythematosus, unspecified: M32.9

## 2014-04-07 LAB — PULMONARY FUNCTION TEST
DL/VA % pred: 53 %
DL/VA: 2.58 ml/min/mmHg/L
DLCO unc % pred: 53 %
DLCO unc: 12.98 ml/min/mmHg
FEF 25-75 PRE: 0.66 L/s
FEF 25-75 Post: 0.66 L/sec
FEF2575-%CHANGE-POST: 0 %
FEF2575-%PRED-POST: 26 %
FEF2575-%Pred-Pre: 26 %
FEV1-%CHANGE-POST: 0 %
FEV1-%PRED-POST: 58 %
FEV1-%PRED-PRE: 58 %
FEV1-PRE: 1.54 L
FEV1-Post: 1.52 L
FEV1FVC-%CHANGE-POST: -1 %
FEV1FVC-%PRED-PRE: 63 %
FEV6-%Change-Post: 0 %
FEV6-%PRED-POST: 91 %
FEV6-%Pred-Pre: 91 %
FEV6-PRE: 2.98 L
FEV6-Post: 2.97 L
FEV6FVC-%Change-Post: 0 %
FEV6FVC-%PRED-PRE: 99 %
FEV6FVC-%Pred-Post: 99 %
FVC-%Change-Post: 0 %
FVC-%Pred-Post: 91 %
FVC-%Pred-Pre: 91 %
FVC-POST: 3.1 L
FVC-Pre: 3.08 L
POST FEV1/FVC RATIO: 49 %
POST FEV6/FVC RATIO: 96 %
PRE FEV6/FVC RATIO: 97 %
Pre FEV1/FVC ratio: 50 %
RV % PRED: 131 %
RV: 2.56 L
TLC % PRED: 113 %
TLC: 5.73 L

## 2014-04-07 MED ORDER — UMECLIDINIUM-VILANTEROL 62.5-25 MCG/INH IN AEPB: 1.0000 | INHALATION_SPRAY | Freq: Every day | RESPIRATORY_TRACT | Status: DC

## 2014-04-07 NOTE — Assessment & Plan Note (Signed)
Managed elsewhere with Plaquenil

## 2014-04-07 NOTE — Patient Instructions (Signed)
Sample Anoro inhaler to try 1 puff, once daily   See if you find your breathing and exercise tolerance get any better  Order- referral for  Pulmonary Rehab

## 2014-04-07 NOTE — Progress Notes (Signed)
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 She quit smoking 4 years ago.  There is easy dyspnea on exertion, worse in the past month, at least partly related to spring pollens. Little day-to-day change. Wakes with sternal soreness in the morning from work of breathing. Less cough since she moved from a molding house in January where she had been living for the past 10 years. Cough is aggravating her degenerative disc disease. Does wheeze. Nasal stuffiness and sniffing.  Previous history of sinus polyp surgery without history of aspirin allergy. Previous pneumonia.  Aware of GERD and sleeps with head elevated on pillow. Occasional right upper anterior chest pains are not exertional. Never hemoptysis.  Combivent Respimat and Dulera inhalers seem to "burn".uses Ventolin rescue inhaler about once daily.  For systemic lupus on Plaquenil.  She had lost a lot of weight when husband died of pancreatic cancer, but weight is now stable  04/07/14- 58 yo F former smoker followed for COPD, rhinitis, complicated by SLE, GERD FOLLOWS FOR:Pt states feels like she can't breathe at night when lying down. Review PFT and 6MW. Pt would like to know if her breathing issues causes her to have trouble with her legs-she states she has RLS and knee/hip trouble but does not feel this is the cause. Pending arthroscopies for arthritis in her knees. When lying down at night, propped up on pillow, lower anterior calves ache. Suggest possibility this is nerve root irritation from her degenerative disc disease. Taking Vicodin. Able to mow her own lawn but notices the backs of her hands get dark while she is pushing the mower. 6MWT 04/07/14-94%, 89%, 96%, 418.5 m with complaint of leg, back and hip pains related to known arthritis. No oxygen limitation on this test PFT 04/07/14-moderately severe obstructive  airways disease-emphysema, moderately severe diffusion, insignificant response to bronchodilator. FVC 3.10/91%, FEV1 1.52/58%, FEV1/FVC 0.49, FEF 25-75% 0.66, TLC 113%, RV 131%, DLCO 53%. 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  Electronically Signed  By: Laveda AbbeJeff Hu M.D.  On: 02/19/2014 16:21   ROS-see HPI Constitutional:   +weight loss, night sweats, fevers, chills, fatigue, lassitude. HEENT:   No-  headaches, difficulty swallowing, tooth/dental problems, sore throat,       No-  sneezing, itching, ear ache, nasal congestion, post nasal drip,  CV:  +chest pain, no- orthopnea, PND, swelling in lower extremities, anasarca,                                  dizziness, palpitations Resp:+shortness of breath with exertion or at rest.              + productive cough,  No non-productive cough,  No- coughing up of blood.              No-   change in color of mucus.  No- wheezing.   Skin: No-   rash or lesions. GI:  No-   heartburn, indigestion, abdominal pain, nausea, vomiting,  GU:  MS:  +joint pain or swelling.  No- decreased range of motion.  + back pain. Neuro-     nothing unusual Psych:  No- change in mood or affect. No depression or anxiety.  No memory loss.  OBJ- Physical Exam General- Alert, Oriented, Affect-appropriate, Distress- none acute, slender Skin- rash-none, lesions- none, excoriation- none.  Piercings Lymphadenopathy- none Head- atraumatic            Eyes- Gross vision intact, PERRLA, conjunctivae and secretions clear            Ears- Hearing, canals-normal            Nose- Clear, no-Septal dev, mucus, polyps, erosion, perforation             Throat- Mallampati II , mucosa clear , drainage- none, tonsils- atrophic, +throat clearing Neck- flexible , trachea midline, no stridor , thyroid nl, carotid no bruit Chest - symmetrical excursion , unlabored           Heart/CV- RRR , no murmur , no gallop  , no rub, nl s1 s2                            - JVD- none , edema- none, stasis changes- none, varices- none           Lung- clear to P&A, wheeze- none, cough+ light , dullness-none, rub- none           Chest wall-  Abd-  Br/ Gen/ Rectal- Not done, not indicated Extrem- cyanosis- none, clubbing, none, atrophy- none, strength- nl. +bilateral knee braces Neuro- grossly intact to observation

## 2014-04-07 NOTE — Assessment & Plan Note (Signed)
Managed elsewhere 

## 2014-04-07 NOTE — Progress Notes (Signed)
Documentation for 6 minute walk test 

## 2014-04-07 NOTE — Assessment & Plan Note (Signed)
Mainly emphysema component now with occasional acute bronchitis during exacerbations. Not much potential for response to bronchodilator but we discussed options. Consider overnight oximetry at next visit Plan- sample Anoro, refer for pulmonary debilitation

## 2014-04-07 NOTE — Assessment & Plan Note (Signed)
Uncertain proportions of emphysema and chronic bronchitis. Plan-try Pulmicort inhaler instead of Dulera. Uses Ventolin instead of Combivent., Chest x-ray, pneumonia vaccine, schedule PFT and 6 minute walk test

## 2014-04-09 ENCOUNTER — Telehealth: Payer: Self-pay | Admitting: Internal Medicine

## 2014-04-09 MED ORDER — FLUTICASONE FUROATE-VILANTEROL 100-25 MCG/INH IN AEPB: 1.0000 | INHALATION_SPRAY | Freq: Every day | RESPIRATORY_TRACT | Status: DC

## 2014-04-09 NOTE — Telephone Encounter (Signed)
Sorry- That's not a reaction I have seen before with anoro.  Offer to substitute a sample of Breo 100, 1 puff then rinse mouth, one time daily. This would replace her Qvar until the sample is used up.

## 2014-04-09 NOTE — Telephone Encounter (Signed)
Called spoke with pt. She reports she tried the anoro yesterday and in middle of night she "lost all control of bowels". Also c/o hoarseness. She is requesting alternative to this. Please advise CDY thanks  Allergies  Allergen Reactions  . Other     Darvocet-"deathly" sick  . Nubain [Nalbuphine Hcl]   . Prednisone     Unsure of reaction

## 2014-04-09 NOTE — Telephone Encounter (Signed)
lmomtcb x1 

## 2014-04-09 NOTE — Telephone Encounter (Signed)
Pt called back. She lives in [place] and can't come for sample. She wants RX sent to the pharm. Nothing further needed

## 2014-05-07 ENCOUNTER — Other Ambulatory Visit: Payer: Self-pay | Admitting: Internal Medicine

## 2014-05-11 ENCOUNTER — Telehealth (HOSPITAL_COMMUNITY): Payer: Self-pay

## 2014-05-11 NOTE — Telephone Encounter (Signed)
I have called and left a message with Lakesa to inquire about participation in Pulmonary Rehab. Patient stated back on 04/16/14 that she was interested in the program and would check with her insurance to verify coverage.  Patient did not follow up.

## 2014-05-14 ENCOUNTER — Other Ambulatory Visit: Payer: Self-pay | Admitting: Internal Medicine

## 2014-06-11 ENCOUNTER — Other Ambulatory Visit: Payer: Self-pay | Admitting: Internal Medicine

## 2014-06-14 ENCOUNTER — Other Ambulatory Visit: Payer: Self-pay | Admitting: Internal Medicine

## 2014-06-15 ENCOUNTER — Telehealth (HOSPITAL_COMMUNITY): Payer: Self-pay

## 2014-06-15 NOTE — Telephone Encounter (Signed)
Called patient in regards to entrance into Pulmonary Rehab. Patient is interested and has already verified coverage with her insurance company but states that she wants to be put on hold for now because she is going through "a rough patch with lupus".

## 2014-06-26 ENCOUNTER — Telehealth: Payer: Self-pay | Admitting: Internal Medicine

## 2014-06-26 MED ORDER — BECLOMETHASONE DIPROPIONATE 80 MCG/ACT IN AERS: 2.0000 | INHALATION_SPRAY | Freq: Two times a day (BID) | RESPIRATORY_TRACT | Status: DC

## 2014-06-26 NOTE — Telephone Encounter (Signed)
Pt aware of rec's per CDY.  Refill sent to [facility] Nothing further needed.

## 2014-06-26 NOTE — Telephone Encounter (Signed)
Pt states that she has been having sore throat and hoarseness since starting Breo. Pt states that she was rinsing out mouth after every use. Denies any pain inside mouth or tongue.  Stopped Breo x 2 days ago. Pt states that she is sick with a cold but that her cold is unrelated to the symptoms [facility] had [facility] Cold symptoms started long after symptoms the sore throat and hoarseness appeared.  Pt wanting to go back to QVAR.   Please advise Dr Maple HudsonYoung. Thanks.  Allergies  Allergen Reactions  . Other     Darvocet-"deathly" sick  . Nubain [Nalbuphine Hcl]   . Prednisone     Unsure of reaction   Current Outpatient Prescriptions on File Prior to Visit  Medication Sig Dispense Refill  . albuterol (VENTOLIN HFA) 108 (90 BASE) MCG/ACT inhaler Inhale 2 puffs into the lungs every 6 (six) hours as needed for wheezing or shortness of breath.      . beclomethasone (QVAR) 80 MCG/ACT inhaler Inhale 2 puffs into the lungs 2 (two) times daily. Rinse mouth well after use  1 Inhaler  6  . BREO ELLIPTA 100-25 MCG/INH AEPB INHALE 1 PUFF INTO THE LUNGS DAILY.  60 each  0  . celecoxib (CELEBREX) 200 MG capsule Take 1 capsule by mouth daily.      . clonazePAM (KLONOPIN) 0.5 MG tablet Take 0.5 mg by mouth every 8 (eight) hours as needed for anxiety.      Marland Kitchen. esomeprazole (NEXIUM) 40 MG capsule Take 40 mg by mouth daily at 12 noon.      . estrogen-methylTESTOSTERone (ESTRATEST) 1.25-2.5 MG per tablet Take 1 tablet by mouth daily.      . fentaNYL (DURAGESIC - DOSED MCG/HR) 100 MCG/HR Place 200 mcg onto the skin every 3 (three) days.      . hydrochlorothiazide (HYDRODIURIL) 25 MG tablet Take 25 mg by mouth daily.      Marland Kitchen. HYDROcodone-acetaminophen (LORTAB) 7.5-500 MG per tablet Take 1 tablet by mouth every 6 (six) hours as needed for pain.      Marland Kitchen. HYDROcodone-homatropine (HYCODAN) 5-1.5 MG/5ML syrup Take 5 mLs by mouth every 6 (six) hours as needed for cough.      . hydroxychloroquine (PLAQUENIL) 200 MG tablet Take  300 mg by mouth daily.      Marland Kitchen. LINZESS 145 MCG CAPS capsule Take 1 capsule by mouth daily. Prn constipation      . ondansetron (ZOFRAN-ODT) 8 MG disintegrating tablet Take 8 mg by mouth 2 (two) times daily as needed for nausea or vomiting.      . Prenatal Vit-Fe Fumarate-FA (PRENATAL VITAMIN PO) Take 1 capsule by mouth daily.      . sertraline (ZOLOFT) 100 MG tablet Take 200 mg by mouth daily.      . traZODone (DESYREL) 50 MG tablet Take 4-6 tablets per night as needed      . Umeclidinium-Vilanterol (ANORO ELLIPTA) 62.5-25 MCG/INH AEPB Inhale 1 puff into the lungs daily.  1 each  0   No current facility-administered medications on file prior to visit.

## 2014-06-26 NOTE — Telephone Encounter (Signed)
Pt called back, she gave the wrong meds. She wants to switch back to Quvar instead of breo

## 2014-06-26 NOTE — Telephone Encounter (Signed)
Ok to d/c Breo and restart Qvar 80, # 1, 2 puffs and rinse mouth, twice daily, refill prn

## 2014-08-06 ENCOUNTER — Other Ambulatory Visit (INDEPENDENT_AMBULATORY_CARE_PROVIDER_SITE_OTHER): Payer: BC Managed Care – PPO

## 2014-08-06 ENCOUNTER — Other Ambulatory Visit: Payer: Self-pay | Admitting: Internal Medicine

## 2014-08-06 ENCOUNTER — Encounter: Payer: Self-pay | Admitting: Internal Medicine

## 2014-08-06 ENCOUNTER — Encounter (INDEPENDENT_AMBULATORY_CARE_PROVIDER_SITE_OTHER): Payer: Self-pay

## 2014-08-06 ENCOUNTER — Ambulatory Visit (INDEPENDENT_AMBULATORY_CARE_PROVIDER_SITE_OTHER): Payer: BC Managed Care – PPO | Admitting: Internal Medicine

## 2014-08-06 ENCOUNTER — Ambulatory Visit (INDEPENDENT_AMBULATORY_CARE_PROVIDER_SITE_OTHER)
Admission: RE | Admit: 2014-08-06 | Discharge: 2014-08-06 | Disposition: A | Discharge status: Home / Self Care | Payer: BC Managed Care – PPO | Source: Ambulatory Visit | Attending: Internal Medicine | Admitting: Internal Medicine

## 2014-08-06 VITALS — BP 116/72 | HR 94 | Ht 64.0 in | Wt 112.0 lb

## 2014-08-06 DIAGNOSIS — J209 Acute bronchitis, unspecified: Secondary | ICD-10-CM

## 2014-08-06 DIAGNOSIS — J42 Unspecified chronic bronchitis: Secondary | ICD-10-CM

## 2014-08-06 DIAGNOSIS — J449 Chronic obstructive pulmonary disease, unspecified: Secondary | ICD-10-CM

## 2014-08-06 DIAGNOSIS — J3489 Other specified disorders of nose and nasal sinuses: Secondary | ICD-10-CM

## 2014-08-06 LAB — CBC WITH DIFFERENTIAL/PLATELET
Basophils Absolute: 0 10*3/uL (ref 0.0–0.1)
Basophils Relative: 0.2 % (ref 0.0–3.0)
EOS ABS: 0 10*3/uL (ref 0.0–0.7)
Eosinophils Relative: 0.8 % (ref 0.0–5.0)
HEMATOCRIT: 41.6 % (ref 36.0–46.0)
Hemoglobin: 13.8 g/dL (ref 12.0–15.0)
LYMPHS PCT: 22.3 % (ref 12.0–46.0)
Lymphs Abs: 1.3 10*3/uL (ref 0.7–4.0)
MCHC: 33 g/dL (ref 30.0–36.0)
MCV: 93.5 fl (ref 78.0–100.0)
MONO ABS: 0.6 10*3/uL (ref 0.1–1.0)
MONOS PCT: 10.1 % (ref 3.0–12.0)
NEUTROS ABS: 3.8 10*3/uL (ref 1.4–7.7)
NEUTROS PCT: 66.6 % (ref 43.0–77.0)
PLATELETS: 231 10*3/uL (ref 150.0–400.0)
RBC: 4.45 Mil/uL (ref 3.87–5.11)
RDW: 13.4 % (ref 11.5–15.5)
WBC: 5.7 10*3/uL (ref 4.0–10.5)

## 2014-08-06 NOTE — Progress Notes (Signed)
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 She quit smoking 4 years ago.  There is easy dyspnea on exertion, worse in the past month, at least partly related to spring pollens. Little day-to-day change. Wakes with sternal soreness in the morning from work of breathing. Less cough since she moved from a [place] house in January where she had been living for the past 10 years. Cough is aggravating her degenerative disc disease. Does wheeze. Nasal stuffiness and sniffing.  Previous history of sinus polyp surgery without history of aspirin allergy. Previous pneumonia.  Aware of GERD and sleeps with head elevated on pillow. Occasional right upper anterior chest pains are not exertional. Never hemoptysis.  Combivent Respimat and Dulera inhalers seem to "burn".uses Ventolin rescue inhaler about once daily.  For systemic lupus on Plaquenil.  She had lost a lot of weight when husband died of pancreatic cancer, but weight is now stable  04/07/14- 58 yo F former smoker followed for COPD, rhinitis, complicated by SLE, GERD FOLLOWS FOR:Pt states feels like she can't breathe at night when lying down. Review PFT and 6MW. Pt would like to know if her breathing issues causes her to have trouble with her legs-she states she has RLS and knee/hip trouble but does not feel this is the cause. Pending arthroscopies for arthritis in her knees. When lying down at night, propped up on pillow, lower anterior calves ache. Suggest possibility this is nerve root irritation from her degenerative disc disease. Taking Vicodin. Able to mow her own lawn but notices the backs of her hands get dark while she is pushing the mower. 6MWT 04/07/14-94%, 89%, 96%, 418.5 m with complaint of leg, back and hip pains related to known arthritis. No oxygen limitation on this test PFT 04/07/14-moderately severe obstructive  airways disease-emphysema, moderately severe diffusion, insignificant response to bronchodilator. FVC 3.10/91%, FEV1 1.52/58%, FEV1/FVC 0.49, FEF 25-75% 0.66, TLC 113%, RV 131%, DLCO 53%. 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  Electronically Signed  By: Laveda AbbeJeff Hu M.D.  On: 02/19/2014 16:21   08/06/14- 58 yo F former smoker followed for COPD, rhinitis, complicated by SLE,              GERD                     Had flu vaccine FOLLOW FOR: COPD; had bronchial infection 2 months ago, has been on several antibiotics, not any better, getting hot flashes all the time; fatigued; unable to sleep; coughing up brownish mucus, sometimes pink  Questions low-grade fever with frontal and retro-orbital headaches. Her primary physician had given Z-Pak, doxycycline, Avelox, Levaquin, and Cipro, return to Z-Pak and doxycycline, then Rocephin injection with cortisone twice up to October 23 [place].   ROS-see HPI Constitutional:   +weight loss, +night sweats, fevers, chills, fatigue, lassitude. HEENT:   No-  headaches, difficulty swallowing, tooth/dental problems, sore throat,       No-  sneezing, itching, ear ache, nasal congestion, post nasal drip,  CV:  +chest pain, no- orthopnea, PND, swelling in lower extremities, anasarca,                                  dizziness, palpitations Resp:+shortness of breath with exertion or at rest.              +  productive cough,  No non-productive cough,  No- coughing up of blood.              No-   change in color of mucus.  No- wheezing.   Skin: No-   rash or lesions. GI:  No-   heartburn, indigestion, abdominal pain, nausea, vomiting,  GU:  MS:  +joint pain or swelling.  No- decreased range of motion.  + back pain. Neuro-     nothing unusual Psych:  No- change in mood or affect. No depression or anxiety.  No memory loss.  OBJ- Physical Exam General- Alert, Oriented,  Affect-appropriate, Distress- none acute, slender Skin- rash-none, lesions- none, excoriation- none. Piercings Lymphadenopathy- none Head- atraumatic            Eyes- Gross vision intact, PERRLA, conjunctivae and secretions clear            Ears- Hearing, canals-normal            Nose- Clear, no-Septal dev, mucus, polyps, erosion, perforation             Throat- Mallampati II , mucosa clear , drainage- none, tonsils- atrophic,                       +throat clearing Neck- flexible , trachea midline, no stridor , thyroid nl, carotid no bruit Chest - symmetrical excursion , unlabored           Heart/CV- RRR , no murmur , no gallop  , no rub, nl s1 s2                           - JVD- none , edema- none, stasis changes- none, varices- none           Lung- clear to P&A, wheeze- none, cough+ light , dullness-none, rub- none           Chest wall-  Abd-  Br/ Gen/ Rectal- Not done, not indicated Extrem- cyanosis- none, clubbing, none, atrophy- none, strength- nl.  Neuro- grossly intact to observation

## 2014-08-06 NOTE — Patient Instructions (Signed)
Order- CXR dx Chronic bronchitis with acute exacerbation              Schedule limited CT max fac (sinuses)             Lab- CBC w diff             Alpha 1 antitrypsin assay- phenotype and level            Sputum culture- routine C&S, AFB, Fungal   After you turn in the sputum specimen, then ok to resume the doxycycline while we wait

## 2014-08-07 NOTE — Progress Notes (Signed)
Quick Note:  lmtcb X1 ______ 

## 2014-08-08 NOTE — Assessment & Plan Note (Signed)
She has had a series of antibiotics which have not impacted her cough. Question of her headaches are clue that the real problem is a sinusitis. Plan-chest x-ray, limited CT scan of sinuses, CBC with differential, sputum cultures, a1AT assay

## 2014-08-10 ENCOUNTER — Inpatient Hospital Stay: Admission: RE | Admit: 2014-08-10 | Payer: BC Managed Care – PPO | Source: Ambulatory Visit

## 2014-08-10 ENCOUNTER — Telehealth: Payer: Self-pay | Admitting: Internal Medicine

## 2014-08-10 LAB — ALPHA-1-ANTITRYPSIN: A-1 Antitrypsin, Ser: 168 mg/dL (ref 83–199)

## 2014-08-10 NOTE — Progress Notes (Signed)
Quick Note:  Pt aware of results -- see phone msg. ______ 

## 2014-08-10 NOTE — Telephone Encounter (Signed)
LMTCB

## 2014-08-10 NOTE — Telephone Encounter (Signed)
Alpha 1 antitrypsin level was normal, so not at increased genetic risk for emphysema. The CBC blood counts were normal

## 2014-08-10 NOTE — Telephone Encounter (Signed)
Notes Recorded by Waymon Budgelinton D Young, MD on 08/07/2014 at 3:59 PM CXR- stable with some emphysema in the upper lungs. No active process.  Notes Recorded by Waymon Budgelinton D Young, MD on 08/07/2014 at 4:00 PM CBC- normal blood count   -------  Called, spoke with pt.  Informed her of lab and cxr results per Dr. Maple HudsonYoung.  She verbalized understanding.   Pt states she has not yet been able to bring in sputum and will try to work on this tomorrow. She is requesting Alpha 1 results - Dr. Maple HudsonYoung, pls advise.  Also, pt reports CY wants to see her back in 1 month but had no openings until Jan. 2016.  Pt would like to be seen sooner, if possible.  Katie, pls advise. Thank you.

## 2014-08-10 NOTE — Progress Notes (Signed)
Quick Note:  Pt aware of results -- see phone msg. ______

## 2014-08-10 NOTE — Telephone Encounter (Signed)
Pt calling again for results pt can be reach @  Hm # [phone] or cel # @ [phone].Caren GriffinsStanley A Dalton

## 2014-08-11 ENCOUNTER — Other Ambulatory Visit: Payer: Self-pay | Admitting: Internal Medicine

## 2014-08-11 LAB — ALPHA-1-ANTITRYPSIN PHENOTYP: A-1 Antitrypsin: 160 mg/dL (ref 90–200)

## 2014-08-11 NOTE — Telephone Encounter (Signed)
[phone] returning a call

## 2014-08-11 NOTE — Telephone Encounter (Signed)
Pt advised. Montravious Weigelt, CMA  

## 2014-08-11 NOTE — Telephone Encounter (Signed)
We don't have other tests but there are probably other genetic risks for emphysema. Shared/ similar environments and smoking are obviously also important.

## 2014-08-11 NOTE — Telephone Encounter (Signed)
Called spoke with patient, advised of lab results as stated by CY.  Pt voiced her understanding though she does have an additional question:  Pt and 2 brothers have emphysema - found out over the weekend that youngest brother has emphysema as well.  Pt is asking CY's thoughts on this since her A1AT was normal.  Please advise, thank you.

## 2014-08-14 ENCOUNTER — Other Ambulatory Visit: Payer: Self-pay | Admitting: *Deleted

## 2014-08-14 MED ORDER — FLUTICASONE FUROATE-VILANTEROL 100-25 MCG/INH IN AEPB: INHALATION_SPRAY | RESPIRATORY_TRACT | Status: DC

## 2014-08-18 LAB — ALPHA-1 ANTITRYPSIN PHENOTYPE: A-1 Antitrypsin: 156 mg/dL (ref 83–199)

## 2014-09-16 ENCOUNTER — Telehealth: Payer: Self-pay | Admitting: Internal Medicine

## 2014-09-16 NOTE — Telephone Encounter (Signed)
Spoke with patient-she is aware that we can see her on 10-02-14 at 11:15am slot. Appt has been moved and nothing more needed at this time.

## 2014-09-21 ENCOUNTER — Telehealth (HOSPITAL_COMMUNITY): Payer: Self-pay

## 2014-09-21 NOTE — Telephone Encounter (Signed)
Spoke with patient regarding interest in pulmonary rehab. Patient continues to state she is unable to attend at this time related to her lupus. Instructed patient to call us back when she is ready and able to participate in rehab.

## 2014-10-02 ENCOUNTER — Ambulatory Visit: Payer: BC Managed Care – PPO | Admitting: Internal Medicine

## 2014-11-12 ENCOUNTER — Other Ambulatory Visit: Payer: Self-pay | Admitting: Internal Medicine

## 2014-11-12 ENCOUNTER — Other Ambulatory Visit (INDEPENDENT_AMBULATORY_CARE_PROVIDER_SITE_OTHER): Payer: BLUE CROSS/BLUE SHIELD

## 2014-11-12 ENCOUNTER — Encounter (INDEPENDENT_AMBULATORY_CARE_PROVIDER_SITE_OTHER): Payer: Self-pay

## 2014-11-12 ENCOUNTER — Encounter: Payer: Self-pay | Admitting: Nurse Practitioner

## 2014-11-12 ENCOUNTER — Ambulatory Visit (INDEPENDENT_AMBULATORY_CARE_PROVIDER_SITE_OTHER): Payer: BLUE CROSS/BLUE SHIELD | Admitting: Internal Medicine

## 2014-11-12 VITALS — BP 114/60 | HR 87 | Ht 64.0 in | Wt 104.2 lb

## 2014-11-12 DIAGNOSIS — R634 Abnormal weight loss: Secondary | ICD-10-CM

## 2014-11-12 DIAGNOSIS — R079 Chest pain, unspecified: Secondary | ICD-10-CM

## 2014-11-12 DIAGNOSIS — G8929 Other chronic pain: Secondary | ICD-10-CM

## 2014-11-12 DIAGNOSIS — Z87891 Personal history of nicotine dependence: Secondary | ICD-10-CM

## 2014-11-12 DIAGNOSIS — J449 Chronic obstructive pulmonary disease, unspecified: Secondary | ICD-10-CM

## 2014-11-12 DIAGNOSIS — E0590 Thyrotoxicosis, unspecified without thyrotoxic crisis or storm: Secondary | ICD-10-CM

## 2014-11-12 DIAGNOSIS — J441 Chronic obstructive pulmonary disease with (acute) exacerbation: Secondary | ICD-10-CM

## 2014-11-12 DIAGNOSIS — R0789 Other chest pain: Secondary | ICD-10-CM

## 2014-11-12 LAB — CBC WITH DIFFERENTIAL/PLATELET
BASOS PCT: 0.3 % (ref 0.0–3.0)
Basophils Absolute: 0 10*3/uL (ref 0.0–0.1)
Eosinophils Absolute: 0.1 10*3/uL (ref 0.0–0.7)
Eosinophils Relative: 1 % (ref 0.0–5.0)
HCT: 40.6 % (ref 36.0–46.0)
Hemoglobin: 13.6 g/dL (ref 12.0–15.0)
Lymphocytes Relative: 22.7 % (ref 12.0–46.0)
Lymphs Abs: 1.6 10*3/uL (ref 0.7–4.0)
MCHC: 33.6 g/dL (ref 30.0–36.0)
MCV: 94.4 fl (ref 78.0–100.0)
MONO ABS: 0.7 10*3/uL (ref 0.1–1.0)
Monocytes Relative: 9.3 % (ref 3.0–12.0)
NEUTROS ABS: 4.7 10*3/uL (ref 1.4–7.7)
NEUTROS PCT: 66.7 % (ref 43.0–77.0)
Platelets: 229 10*3/uL (ref 150.0–400.0)
RBC: 4.3 Mil/uL (ref 3.87–5.11)
RDW: 12.7 % (ref 11.5–15.5)
WBC: 7 10*3/uL (ref 4.0–10.5)

## 2014-11-12 LAB — BASIC METABOLIC PANEL
BUN: 15 mg/dL (ref 6–23)
CALCIUM: 9.1 mg/dL (ref 8.4–10.5)
CHLORIDE: 102 meq/L (ref 96–112)
CO2: 32 mEq/L (ref 19–32)
Creatinine, Ser: 0.78 mg/dL (ref 0.40–1.20)
GFR: 80.61 mL/min (ref >60.00)
Glucose, Bld: 82 mg/dL (ref 70–99)
Potassium: 4.1 mEq/L (ref 3.5–5.1)
Sodium: 138 mEq/L (ref 135–145)

## 2014-11-12 LAB — HEPATIC FUNCTION PANEL
ALBUMIN: 4.1 g/dL (ref 3.5–5.2)
ALT: 16 U/L (ref 0–35)
AST: 20 U/L (ref 0–37)
Alkaline Phosphatase: 54 U/L (ref 39–117)
BILIRUBIN TOTAL: 0.4 mg/dL (ref 0.2–1.2)
Bilirubin, Direct: 0.1 mg/dL (ref 0.0–0.3)
Total Protein: 6.8 g/dL (ref 6.0–8.3)

## 2014-11-12 LAB — TSH: TSH: 0.98 u[IU]/mL (ref 0.35–4.50)

## 2014-11-12 NOTE — Progress Notes (Signed)
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 She quit smoking 4 years ago.  There is easy dyspnea on exertion, worse in the past month, at least partly related to spring pollens. Little day-to-day change. Wakes with sternal soreness in the morning from work of breathing. Less cough since she moved from a molding house in January where she had been living for the past 10 years. Cough is aggravating her degenerative disc disease. Does wheeze. Nasal stuffiness and sniffing.  Previous history of sinus polyp surgery without history of aspirin allergy. Previous pneumonia.  Aware of GERD and sleeps with head elevated on pillow. Occasional right upper anterior chest pains are not exertional. Never hemoptysis.  Combivent Respimat and Dulera inhalers seem to "burn".uses Ventolin rescue inhaler about once daily.  For systemic lupus on Plaquenil.  She had lost a lot of weight when husband died of pancreatic cancer, but weight is now stable  04/07/14- 59 yo F former smoker followed for COPD, rhinitis, complicated by SLE, GERD FOLLOWS FOR:Pt states feels like she can't breathe at night when lying down. Review PFT and 6MW. Pt would like to know if her breathing issues causes her to have trouble with her legs-she states she has RLS and knee/hip trouble but does not feel this is the cause. Pending arthroscopies for arthritis in her knees. When lying down at night, propped up on pillow, lower anterior calves ache. Suggest possibility this is nerve root irritation from her degenerative disc disease. Taking Vicodin. Able to mow her own lawn but notices the backs of her hands get dark while she is pushing the mower. 6MWT 04/07/14-94%, 89%, 96%, 418.5 m with complaint of leg, back and hip pains related to known arthritis. No oxygen limitation on this test PFT 04/07/14-moderately severe obstructive  airways disease-emphysema, moderately severe diffusion, insignificant response to bronchodilator. FVC 3.10/91%, FEV1 1.52/58%, FEV1/FVC 0.49, FEF 25-75% 0.66, TLC 113%, RV 131%, DLCO 53%. 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  Electronically Signed  By: Laveda AbbeJeff Hu M.D.  On: 02/19/2014 16:21   08/06/14- 59 yo F former smoker followed for COPD, rhinitis, complicated by SLE,              GERD                     Had flu vaccine FOLLOW FOR: COPD; had bronchial infection 2 months ago, has been on several antibiotics, not any better, getting hot flashes all the time; fatigued; unable to sleep; coughing up brownish mucus, sometimes pink  Questions low-grade fever with frontal and retro-orbital headaches. Her primary physician had given Z-Pak, doxycycline, Avelox, Levaquin, and Cipro, return to Z-Pak and doxycycline, then Rocephin injection with cortisone twice up to October 23 [facility].   11/12/14-59 yo F former smoker followed for COPD/ chronic bronchitis, rhinitis, complicated by SLE, GERD                      FOLLOWS FOR:  has collected a sputum sample for today.  this has been refrigerated. cough with yellow/brown sputum. still hard to sleep.  increase in fatigue.  Brings in a sputum specimen collected over the last 2 days-thick green and representative of what she's been coughing up much of the winter. Started Cipro yesterday for urine infection. 10 pound weight loss with poor appetite.  Interested in seeing GI about this. Frequent squeezing upper anterior chest pain " scares" her. Not clear that this is exertional or affected by food. Has a different sharp twinge pain occasionally around her chest. CXR 08/06/14 IMPRESSION: Stable emphysematous changes. No evidence of pneumonia. Electronically Signed  By: Roxy Horseman M.D.  On: 08/06/2014 18:43  ROS-see HPI Constitutional:   +weight loss, +night sweats,  fevers, chills, fatigue, lassitude. HEENT:   No-  headaches, difficulty swallowing, tooth/dental problems, sore throat,       No-  sneezing, itching, ear ache, nasal congestion, post nasal drip,  CV:  +chest pain, no- orthopnea, PND, swelling in lower extremities, anasarca,                                  dizziness, palpitations Resp:+shortness of breath with exertion or at rest.              + productive cough,  No non-productive cough,  No- coughing up of blood.              + change in color of mucus.  No- wheezing.   Skin: No-   rash or lesions. GI:  No-   heartburn, indigestion, abdominal pain, nausea, vomiting,  GU:  MS:  +joint pain or swelling.  No- decreased range of motion.  + back pain. Neuro-     nothing unusual Psych:  No- change in mood or affect. No depression or anxiety.  No memory loss.  OBJ- Physical Exam General- Alert, Oriented, Affect-appropriate, Distress- none acute, slender Skin- rash-none, lesions- none, excoriation- none. Piercings Lymphadenopathy- none Head- atraumatic            Eyes- Gross vision intact, PERRLA, conjunctivae and secretions clear            Ears- Hearing, canals-normal            Nose- Clear, no-Septal dev, mucus, polyps, erosion, perforation             Throat- Mallampati II , mucosa clear , drainage- none, tonsils- atrophic,                       +throat clearing Neck- flexible , trachea midline, no stridor , thyroid nl, carotid no bruit Chest - symmetrical excursion , unlabored           Heart/CV- RRR , no murmur , no gallop  , no rub, nl s1 s2                           - JVD- none , edema- none, stasis changes- none, varices- none           Lung- clear to P&A, wheeze- none, cough+ deep/raspy , dullness-none, rub- none           Chest wall-  Abd-  Br/ Gen/ Rectal- Not done, not indicated Extrem- cyanosis- none, clubbing, none, atrophy- none, strength- nl.  Neuro- grossly intact to observation

## 2014-11-12 NOTE — Patient Instructions (Signed)
Order- Lab- Sputum C&S routine, fungal, AFB        Dx Chronic bronchitis with exacerbation took Cipro                     CBC w diff, BMET, hepatic panel, TSH    Dx weight loss, hyperthyroid  Order- Refer to cardiology - former smoker with recurrent chest pain, for risk stratification  Order- Refer to GI - weight loss  Please call as needed

## 2014-11-13 ENCOUNTER — Encounter: Payer: Self-pay | Admitting: Internal Medicine

## 2014-11-13 DIAGNOSIS — R634 Abnormal weight loss: Secondary | ICD-10-CM | POA: Insufficient documentation

## 2014-11-13 DIAGNOSIS — R0789 Other chest pain: Secondary | ICD-10-CM | POA: Insufficient documentation

## 2014-11-13 DIAGNOSIS — R079 Chest pain, unspecified: Secondary | ICD-10-CM | POA: Insufficient documentation

## 2014-11-13 HISTORY — DX: Other chest pain: R07.89

## 2014-11-13 NOTE — Assessment & Plan Note (Signed)
10 pound weight loss since November. Poor appetite. Denies obvious lower GI distress. This may be consequence of chronic disease but she is worried and would like assessment by GI. Plan-labs including CBC, chemistry panel, TSH. Refer to GI for evaluation of weight loss/anorexia

## 2014-11-13 NOTE — Assessment & Plan Note (Signed)
Some of her pain is related to chest wall and may be costochondritis. Less certain in this former smoker with COPD, whether the squeezing anterior chest pain might be angina. I suggested cardiology risk stratification Plan refer to cardiology for risk stratification because of smoking history and chest pain

## 2014-11-13 NOTE — Assessment & Plan Note (Signed)
Exacerbation this winter and especially in the last week or 2. Quality of sputum suggest bronchiectasis. Plan-sputum cultures

## 2014-11-15 LAB — RESPIRATORY CULTURE OR RESPIRATORY AND SPUTUM CULTURE
Culture: NORMAL
Organism ID, Bacteria: NORMAL

## 2014-11-17 ENCOUNTER — Telehealth: Payer: Self-pay | Admitting: Internal Medicine

## 2014-11-17 NOTE — Telephone Encounter (Signed)
Pt is aware that her final result for her sputum culture has not come back yet. Advised her that we will call her once it does.

## 2014-11-23 ENCOUNTER — Ambulatory Visit: Payer: BLUE CROSS/BLUE SHIELD | Admitting: Internal Medicine

## 2014-11-24 ENCOUNTER — Ambulatory Visit: Payer: BLUE CROSS/BLUE SHIELD | Admitting: Nurse Practitioner

## 2014-12-08 LAB — FUNGUS CULTURE W SMEAR

## 2014-12-28 ENCOUNTER — Telehealth: Payer: Self-pay | Admitting: Internal Medicine

## 2014-12-28 NOTE — Telephone Encounter (Signed)
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

## 2014-12-30 ENCOUNTER — Ambulatory Visit: Payer: BLUE CROSS/BLUE SHIELD | Admitting: Interventional Cardiology

## 2015-01-12 NOTE — Telephone Encounter (Signed)
Please advise Florentina Addisonkatie if you have received this report and if the patient has been called. Thanks

## 2015-01-13 NOTE — Telephone Encounter (Signed)
Please do send for lab identification as requested.

## 2015-01-13 NOTE — Telephone Encounter (Signed)
[facility] and [facility] at [phone]. The culture was already sent off per Sharlynn OliphantJess' note. Results are now final, results requesting to be re-faxed to triage fax.   Will forward to Katie to look out for results.

## 2015-02-04 LAB — REFERRED ASSAY

## 2015-02-11 ENCOUNTER — Ambulatory Visit: Payer: BLUE CROSS/BLUE SHIELD | Admitting: Internal Medicine

## 2015-02-16 LAB — AFB CULTURE WITH SMEAR (NOT AT ARMC): ACID FAST SMEAR: NONE SEEN

## 2015-03-23 ENCOUNTER — Telehealth: Payer: Self-pay | Admitting: *Deleted

## 2015-03-23 NOTE — Telephone Encounter (Signed)
PA received for Qvar 80; Pt has been on this since 02/19/14. PA submitted via cover my meds. Key: Domingo [id number]

## 2015-03-25 NOTE — Telephone Encounter (Signed)
Change Qvar to Flovent 110     2 puffs then rinse mouth, twice daily

## 2015-03-25 NOTE — Telephone Encounter (Signed)
Pts formulary does not cover Qvar. Pt must 1st try and fail Asmanex and Flovent.

## 2015-03-26 NOTE — Telephone Encounter (Signed)
lmtcb x1 for pt. 

## 2015-03-31 ENCOUNTER — Other Ambulatory Visit: Payer: Self-pay | Admitting: *Deleted

## 2015-03-31 MED ORDER — FLUTICASONE PROPIONATE HFA 110 MCG/ACT IN AERO: 2.0000 | INHALATION_SPRAY | Freq: Two times a day (BID) | RESPIRATORY_TRACT | Status: DC

## 2015-03-31 NOTE — Telephone Encounter (Signed)
Left message of Qvar being changed to Flovent due to patient ins. She is to take 2 puffs then rinse mouth twice daily.

## 2015-05-03 ENCOUNTER — Ambulatory Visit (INDEPENDENT_AMBULATORY_CARE_PROVIDER_SITE_OTHER): Payer: 59 | Admitting: Internal Medicine

## 2015-05-03 ENCOUNTER — Encounter: Payer: Self-pay | Admitting: Internal Medicine

## 2015-05-03 VITALS — BP 114/76 | HR 75 | Ht 64.0 in | Wt 98.4 lb

## 2015-05-03 DIAGNOSIS — J449 Chronic obstructive pulmonary disease, unspecified: Secondary | ICD-10-CM

## 2015-05-03 DIAGNOSIS — R634 Abnormal weight loss: Secondary | ICD-10-CM | POA: Diagnosis not present

## 2015-05-03 NOTE — Progress Notes (Signed)
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 She quit smoking 4 years ago.  There is easy dyspnea on exertion, worse in the past month, at least partly related to spring pollens. Little day-to-day change. Wakes with sternal soreness in the morning from work of breathing. Less cough since she moved from a molding house in January where she had been living for the past 10 years. Cough is aggravating her degenerative disc disease. Does wheeze. Nasal stuffiness and sniffing.  Previous history of sinus polyp surgery without history of aspirin allergy. Previous pneumonia.  Aware of GERD and sleeps with head elevated on pillow. Occasional right upper anterior chest pains are not exertional. Never hemoptysis.  Combivent Respimat and Dulera inhalers seem to "burn".uses Ventolin rescue inhaler about once daily.  For systemic lupus on Plaquenil.  She had lost a lot of weight when husband died of pancreatic cancer, but weight is now stable  04/07/14- 79 yo F former smoker followed for COPD, rhinitis, complicated by SLE, GERD FOLLOWS FOR:Pt states feels like she can't breathe at night when lying down. Review PFT and . Pt would like to know if her breathing issues causes her to have trouble with her legs-she states she has RLS and knee/hip trouble but does not feel this is the cause. Pending arthroscopies for arthritis in her knees. When lying down at night, propped up on pillow, lower anterior calves ache. Suggest possibility this is nerve root irritation from her degenerative disc disease. Taking Vicodin. Able to mow her own lawn but notices the backs of her hands get dark while she is pushing the mower. 04/07/14-94%, 89%, 96%, 418.5 m with complaint of leg, back and hip pains related to known arthritis. No oxygen limitation on this test PFT 04/07/14-moderately severe obstructive  airways disease-emphysema, moderately severe diffusion, insignificant response to bronchodilator. FVC 3.10/91%, FEV1 1.52/58%, FEV1/FVC 0.49, FEF 25-75% 0.66, TLC 113%, RV 131%, DLCO 53%. 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  Electronically Signed  By: Laveda Abbe M.D.  On: 02/19/2014 16:21   08/06/14- 60 yo F former smoker followed for COPD, rhinitis, complicated by SLE,              GERD                     Had flu vaccine FOLLOW FOR: COPD; had bronchial infection 2 months ago, has been on several antibiotics, not any better, getting hot flashes all the time; fatigued; unable to sleep; coughing up brownish mucus, sometimes pink  Questions low-grade fever with frontal and retro-orbital headaches. Her primary physician had given Z-Pak, doxycycline, Avelox, Levaquin, and Cipro, return to Z-Pak and doxycycline, then Rocephin injection with cortisone twice up to October 23 Continues plaquenil from rheumatologist in [facility].   11/12/14-59 yo F former smoker followed for COPD/ chronic bronchitis, rhinitis, complicated by SLE, GERD                      FOLLOWS FOR:  has collected a sputum sample for today.  this has been refrigerated. cough with yellow/brown sputum. still hard to sleep.  increase in fatigue.  Brings in a sputum specimen collected over the last 2 days-thick green and representative of what she's been coughing up much of the winter. Started Cipro yesterday for urine infection. 10 pound weight loss with poor appetite.  Interested in seeing GI about this. Frequent squeezing upper anterior chest pain " scares" her. Not clear that this is exertional or affected by food. Has a different sharp twinge pain occasionally around her chest. CXR 08/06/14 IMPRESSION: Stable emphysematous changes. No evidence of pneumonia. Electronically Signed  By: Roxy Horseman M.D.  On: 08/06/2014 18:43  05/03/15- 57 yo F former smoker followed for COPD/ chronic  bronchitis, rhinitis, complicated by SLE, GERD FOLLOWS FOR: Pt states her breathing has not been good; has not been able to afford her meds due to insurance changes,etc. Reports poor appetite, has trouble keeping her weight up although she uses an sure and eats ice cream.  Increased shortness of breath and dyspnea on exertion seem partly related to weakness. She denies fever, night sweats. Minimal cough with a little green sputum 2 months ago but nothing since. No chest pain or adenopathy. Reports chest x-ray 3 months ago at [place] understood to be stable.  ROS-see HPI Constitutional:   +weight loss, +night sweats, fevers, chills, fatigue, lassitude. HEENT:   No-  headaches, difficulty swallowing, tooth/dental problems, sore throat,       No-  sneezing, itching, ear ache, nasal congestion, post nasal drip,  CV:  +chest pain, no- orthopnea, PND, swelling in lower extremities, anasarca,                                                            dizziness, palpitations Resp:+shortness of breath with exertion or at rest.              + productive cough,  No non-productive cough,  No- coughing up of blood.              + change in color of mucus.  No- wheezing.   Skin: No-   rash or lesions. GI:  No-   heartburn, indigestion, abdominal pain, nausea, vomiting,  GU:  MS:  +joint pain or swelling.  No- decreased range of motion.  + back pain. Neuro-     nothing unusual Psych:  No- change in mood or affect. No depression or anxiety.  No memory loss.  OBJ- Physical Exam General- Alert, Oriented, Affect-appropriate, Distress- none acute, + thin Skin- rash-none, lesions- none, excoriation- none. Piercings Lymphadenopathy- none Head- atraumatic            Eyes- Gross vision intact, PERRLA, conjunctivae and secretions clear            Ears- Hearing, canals-normal            Nose- Clear, no-Septal dev, mucus, polyps, erosion, perforation             Throat- Mallampati II , mucosa clear , drainage-  none, tonsils- atrophic,                                               +throat clearing Neck- flexible , trachea midline, no stridor , thyroid nl, carotid no bruit Chest - symmetrical excursion , unlabored           Heart/CV- RRR , no murmur , no gallop  , no rub, nl s1 s2                           -  JVD- none , edema- none, stasis changes- none, varices- none           Lung- clear to P&A, wheeze- none, cough-none , dullness-none, rub- none           Chest wall-  Abd-  Br/ Gen/ Rectal- Not done, not indicated Extrem- cyanosis- none, clubbing, none, atrophy- none, strength- nl.  Neuro- grossly intact to observation

## 2015-05-03 NOTE — Patient Instructions (Signed)
Medicare assistance phone number given. See if enrolling with them can get you some assistance especially with medicine costs.  Comparable albuterol rescue inhalers- Ventolin, Proventil, Proair  Sample Anoro Ellipta inhaler    1 puff, once daily  Please call as needed

## 2015-05-09 NOTE — Assessment & Plan Note (Signed)
Managed by her primary physician. We discussed importance of maintaining muscle mass as a component of her exercise tolerance.

## 2015-05-09 NOTE — Assessment & Plan Note (Signed)
She uses inhaler to help her breathing when she goes out in current hot weather but doesn't seem to recognize much in the way of wheeze, cough, chest tightness. Plan-discussed use of inhalers.

## 2015-11-02 DIAGNOSIS — G629 Polyneuropathy, unspecified: Secondary | ICD-10-CM | POA: Insufficient documentation

## 2015-11-02 DIAGNOSIS — M791 Myalgia, unspecified site: Secondary | ICD-10-CM

## 2015-11-02 DIAGNOSIS — I671 Cerebral aneurysm, nonruptured: Secondary | ICD-10-CM

## 2015-11-02 DIAGNOSIS — M609 Myositis, unspecified: Secondary | ICD-10-CM

## 2015-11-02 DIAGNOSIS — J45909 Unspecified asthma, uncomplicated: Secondary | ICD-10-CM | POA: Insufficient documentation

## 2015-11-02 DIAGNOSIS — Z87898 Personal history of other specified conditions: Secondary | ICD-10-CM | POA: Insufficient documentation

## 2015-11-02 DIAGNOSIS — M25569 Pain in unspecified knee: Secondary | ICD-10-CM

## 2015-11-02 DIAGNOSIS — J321 Chronic frontal sinusitis: Secondary | ICD-10-CM

## 2015-11-02 DIAGNOSIS — M26629 Arthralgia of temporomandibular joint, unspecified side: Secondary | ICD-10-CM

## 2015-11-02 DIAGNOSIS — J340 Abscess, furuncle and carbuncle of nose: Secondary | ICD-10-CM | POA: Insufficient documentation

## 2015-11-02 DIAGNOSIS — M7989 Other specified soft tissue disorders: Secondary | ICD-10-CM

## 2015-11-02 DIAGNOSIS — Z87891 Personal history of nicotine dependence: Secondary | ICD-10-CM

## 2015-11-02 DIAGNOSIS — R059 Cough, unspecified: Secondary | ICD-10-CM | POA: Insufficient documentation

## 2015-11-02 DIAGNOSIS — Z79899 Other long term (current) drug therapy: Secondary | ICD-10-CM | POA: Insufficient documentation

## 2015-11-02 DIAGNOSIS — R600 Localized edema: Secondary | ICD-10-CM | POA: Insufficient documentation

## 2015-11-02 DIAGNOSIS — M25562 Pain in left knee: Secondary | ICD-10-CM

## 2015-11-02 DIAGNOSIS — H9190 Unspecified hearing loss, unspecified ear: Secondary | ICD-10-CM

## 2015-11-02 DIAGNOSIS — J320 Chronic maxillary sinusitis: Secondary | ICD-10-CM | POA: Insufficient documentation

## 2015-11-02 DIAGNOSIS — J309 Allergic rhinitis, unspecified: Secondary | ICD-10-CM | POA: Insufficient documentation

## 2015-11-02 DIAGNOSIS — R809 Proteinuria, unspecified: Secondary | ICD-10-CM

## 2015-11-02 DIAGNOSIS — K219 Gastro-esophageal reflux disease without esophagitis: Secondary | ICD-10-CM | POA: Insufficient documentation

## 2015-11-02 HISTORY — DX: Myositis, unspecified: M60.9

## 2015-11-02 HISTORY — DX: Pain in left knee: M25.562

## 2015-11-02 HISTORY — DX: Personal history of nicotine dependence: Z87.891

## 2015-11-02 HISTORY — DX: Myalgia, unspecified site: M79.10

## 2015-11-02 HISTORY — DX: Cough, unspecified: R05.9

## 2015-11-02 HISTORY — DX: Chronic frontal sinusitis: J32.1

## 2015-11-02 HISTORY — DX: Unspecified hearing loss, unspecified ear: H91.90

## 2015-11-02 HISTORY — DX: Other long term (current) drug therapy: Z79.899

## 2015-11-02 HISTORY — DX: Unspecified asthma, uncomplicated: J45.909

## 2015-11-02 HISTORY — DX: Cerebral aneurysm, nonruptured: I67.1

## 2015-11-02 HISTORY — DX: Other specified soft tissue disorders: M79.89

## 2015-11-02 HISTORY — DX: Arthralgia of temporomandibular joint, unspecified side: M26.629

## 2015-11-02 HISTORY — DX: Localized edema: R60.0

## 2015-11-02 HISTORY — DX: Chronic maxillary sinusitis: J32.0

## 2015-11-02 HISTORY — DX: Abscess, furuncle and carbuncle of nose: J34.0

## 2015-11-02 HISTORY — DX: Proteinuria, unspecified: R80.9

## 2015-11-02 HISTORY — DX: Personal history of other specified conditions: Z87.898

## 2015-11-02 HISTORY — DX: Pain in unspecified knee: M25.569

## 2015-11-02 HISTORY — DX: Polyneuropathy, unspecified: G62.9

## 2015-11-02 HISTORY — DX: Allergic rhinitis, unspecified: J30.9

## 2015-11-04 ENCOUNTER — Ambulatory Visit (INDEPENDENT_AMBULATORY_CARE_PROVIDER_SITE_OTHER): Payer: 59 | Admitting: Internal Medicine

## 2015-11-04 ENCOUNTER — Encounter: Payer: Self-pay | Admitting: Internal Medicine

## 2015-11-04 VITALS — BP 118/68 | HR 101 | Ht 64.0 in | Wt 93.4 lb

## 2015-11-04 DIAGNOSIS — R634 Abnormal weight loss: Secondary | ICD-10-CM

## 2015-11-04 DIAGNOSIS — J449 Chronic obstructive pulmonary disease, unspecified: Secondary | ICD-10-CM

## 2015-11-04 DIAGNOSIS — E0590 Thyrotoxicosis, unspecified without thyrotoxic crisis or storm: Secondary | ICD-10-CM | POA: Diagnosis not present

## 2015-11-04 DIAGNOSIS — E46 Unspecified protein-calorie malnutrition: Secondary | ICD-10-CM

## 2015-11-04 DIAGNOSIS — J479 Bronchiectasis, uncomplicated: Secondary | ICD-10-CM

## 2015-11-04 NOTE — Patient Instructions (Signed)
Order- CT chest high resolution, no contrast    Dx bronchiectasis  Order- lab-  CBC w diff, hepatic panel, Bmet, TSH   Dx protein calorie malnutrition, hyperthyroid  Script sent for Septra-DS sulfa drug for infection  [facility] and diet evaluation for protein calorie malnutrition

## 2015-11-04 NOTE — Progress Notes (Signed)
02/19/14- 60 yoF self referred for c/o COPD and chronic bronchitis complicated by hx cerebral artery  aneurysm, depression Patient was diagnosed with COPD in the early 2000's. She quit smoking 4 years ago.  There is easy dyspnea on exertion, worse in the past month, at least partly related to spring pollens. Little day-to-day change. Wakes with sternal soreness in the morning from work of breathing. Less cough since she moved from a molding house in January where she had been living for the past 10 years. Cough is aggravating her degenerative disc disease. Does wheeze. Nasal stuffiness and sniffing.  Previous history of sinus polyp surgery without history of aspirin allergy. Previous pneumonia.  Aware of GERD and sleeps with head elevated on pillow. Occasional right upper anterior chest pains are not exertional. Never hemoptysis.  Combivent Respimat and Dulera inhalers seem to "burn".uses Ventolin rescue inhaler about once daily.  For systemic lupus on Plaquenil.  She had lost a lot of weight when husband died of pancreatic cancer, but weight is now stable  04/07/14- 60 yo F former smoker followed for COPD, rhinitis, complicated by SLE, GERD FOLLOWS FOR:Pt states feels like she can't breathe at night when lying down. Review PFT and . Pt would like to know if her breathing issues causes her to have trouble with her legs-she states she has RLS and knee/hip trouble but does not feel this is the cause. Pending arthroscopies for arthritis in her knees. When lying down at night, propped up on pillow, lower anterior calves ache. Suggest possibility this is nerve root irritation from her degenerative disc disease. Taking Vicodin. Able to mow her own lawn but notices the backs of her hands get dark while she is pushing the mower. 59/14/15-94%, 89%, 96%, 418.5 m with complaint of leg, back and hip pains related to known arthritis. No oxygen limitation on this test PFT 04/07/14-moderately severe obstructive  airways disease-emphysema, moderately severe diffusion, insignificant response to bronchodilator. FVC 3.10/91%, FEV1 1.52/58%, FEV1/FVC 0.49, FEF 25-75% 0.66, TLC 113%, RV 131%, DLCO 53%. 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  Electronically Signed  By: Laveda Abbe M.D.  On: 02/19/2014 16:21   08/06/14- 60 yo F former smoker followed for COPD, rhinitis, complicated by SLE,              GERD                     Had flu vaccine FOLLOW FOR: COPD; had bronchial infection 2 months ago, has been on several antibiotics, not any better, getting hot flashes all the time; fatigued; unable to sleep; coughing up brownish mucus, sometimes pink  Questions low-grade fever with frontal and retro-orbital headaches. Her primary physician had given Z-Pak, doxycycline, Avelox, Levaquin, and Cipro, return to Z-Pak and doxycycline, then Rocephin injection with cortisone twice up to October 23 Continues plaquenil from rheumatologist in [facility].   11/12/14-60 yo F former smoker followed for COPD/ chronic bronchitis, rhinitis, complicated by SLE, GERD                      FOLLOWS FOR:  has collected a sputum sample for today.  this has been refrigerated. cough with yellow/brown sputum. still hard to sleep.  increase in fatigue.  Brings in a sputum specimen collected over the last 2 days-thick green and representative of what she's been coughing up much of the winter. Started Cipro yesterday for urine infection. 10 pound weight loss with poor appetite.  Interested in seeing GI about this. Frequent squeezing upper anterior chest pain " scares" her. Not clear that this is exertional or affected by food. Has a different sharp twinge pain occasionally around her chest. CXR 08/06/14 IMPRESSION: Stable emphysematous changes. No evidence of pneumonia. Electronically Signed  By: Roxy Horseman M.D.  On: 08/06/2014 18:43  05/03/15- 63 yo F former smoker followed for COPD/ chronic  bronchitis, rhinitis, complicated by SLE, GERD FOLLOWS FOR: Pt states her breathing has not been good; has not been able to afford her meds due to insurance changes,etc. Reports poor appetite, has trouble keeping her weight up although she uses an sure and eats ice cream.  Increased shortness of breath and dyspnea on exertion seem partly related to weakness. She denies fever, night sweats. Minimal cough with a little green sputum 2 months ago but nothing since. No chest pain or adenopathy. Reports chest x-ray 3 months ago at [place] understood to be stable.  11/04/2015-60 year old female former smoker followed for COPD/chronic bronchitis, rhinitis, complicated by SLE, GERD Sputum cultures 11/12/2014-Normal Flora, + Nocardia FOLLOWS FOR:Pt having hard time eating and keeping weight on; continues to have increased congestion. Morning cough is productive. Respiratory status really not changed. Her main concern is ongoing weight loss. Occasional soaking night sweats. Colonoscopy 1 year ago by Dr. Charm Barges in [place] but no nutrition workup. Not much aware of reflux in recent years.  ROS-see HPI Constitutional:   +weight loss, +night sweats, fevers, chills, fatigue, lassitude. HEENT:   No-  headaches, difficulty swallowing, tooth/dental problems, sore throat,       No-  sneezing, itching, ear ache, nasal congestion, post nasal drip,  CV:  +chest pain, no- orthopnea, PND, swelling in lower extremities, anasarca,                                                            dizziness, palpitations Resp:+shortness of breath with exertion or at rest.              + productive cough,  No non-productive cough,  No- coughing up of blood.              + change in color of mucus.  No- wheezing.   Skin: No-   rash or lesions. GI:  No-   heartburn, indigestion, abdominal pain, nausea, vomiting,  GU:  MS:  +joint pain or swelling.  No- decreased range of motion.  + back pain. Neuro-     nothing unusual Psych:   No- change in mood or affect. No depression or anxiety.  No memory loss.  OBJ- Physical Exam General- Alert, Oriented, Affect-appropriate, Distress- none acute, + thin Skin- rash-none, lesions- none, excoriation- none. Piercings Lymphadenopathy- none Head- atraumatic            Eyes- Gross vision intact, PERRLA, conjunctivae and secretions clear            Ears- Hearing, canals-normal            Nose- Clear, no-Septal dev, mucus, polyps, erosion, perforation             Throat- Mallampati II , mucosa clear , drainage- none, tonsils- atrophic,                       Neck- flexible ,  trachea midline, no stridor , thyroid nl, carotid no bruit Chest - symmetrical excursion , unlabored           Heart/CV- RRR , no murmur , no gallop  , no rub, nl s1 s2                           - JVD- none , edema- none, stasis changes- none, varices- none           Lung- clear to P&A, wheeze- none, + slight dry , dullness-none, rub- none           Chest wall-  Abd-  Br/ Gen/ Rectal- Not done, not indicated Extrem- cyanosis- none, clubbing, none, atrophy- none, strength- nl.  Neuro- grossly intact to observation

## 2015-11-04 NOTE — Assessment & Plan Note (Signed)
I'm concerned she may have more bronchiectasis then we realize. She agrees to CT chest for this. 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 Plan-Septra

## 2015-11-04 NOTE — Assessment & Plan Note (Signed)
Protein calorie malnutrition with gradual weight loss. Her actual calorie intake is unclear although she insists she uses Ensure. Dietitian could help with calorie count and guidance. She had routine screening colonoscopy year ago and may need to go back to GI in [place] for malabsorption evaluation if other explanation not found. Plan-labs including TSH, chemistry and CBC,

## 2015-11-09 ENCOUNTER — Ambulatory Visit (INDEPENDENT_AMBULATORY_CARE_PROVIDER_SITE_OTHER)
Admission: RE | Admit: 2015-11-09 | Discharge: 2015-11-09 | Disposition: A | Discharge status: Home / Self Care | Payer: 59 | Source: Ambulatory Visit | Attending: Internal Medicine | Admitting: Internal Medicine

## 2015-11-09 ENCOUNTER — Other Ambulatory Visit (INDEPENDENT_AMBULATORY_CARE_PROVIDER_SITE_OTHER): Payer: 59

## 2015-11-09 DIAGNOSIS — J479 Bronchiectasis, uncomplicated: Secondary | ICD-10-CM | POA: Diagnosis not present

## 2015-11-09 DIAGNOSIS — E0590 Thyrotoxicosis, unspecified without thyrotoxic crisis or storm: Secondary | ICD-10-CM | POA: Diagnosis not present

## 2015-11-09 DIAGNOSIS — E46 Unspecified protein-calorie malnutrition: Secondary | ICD-10-CM | POA: Diagnosis not present

## 2015-11-09 LAB — CBC WITH DIFFERENTIAL/PLATELET
BASOS PCT: 0.4 % (ref 0.0–3.0)
Basophils Absolute: 0 10*3/uL (ref 0.0–0.1)
EOS PCT: 0.5 % (ref 0.0–5.0)
Eosinophils Absolute: 0 10*3/uL (ref 0.0–0.7)
HEMATOCRIT: 43.8 % (ref 36.0–46.0)
Hemoglobin: 14.7 g/dL (ref 12.0–15.0)
Lymphocytes Relative: 20.3 % (ref 12.0–46.0)
Lymphs Abs: 1.7 10*3/uL (ref 0.7–4.0)
MCHC: 33.6 g/dL (ref 30.0–36.0)
MCV: 93.4 fl (ref 78.0–100.0)
MONO ABS: 0.6 10*3/uL (ref 0.1–1.0)
Monocytes Relative: 7.5 % (ref 3.0–12.0)
NEUTROS ABS: 5.8 10*3/uL (ref 1.4–7.7)
Neutrophils Relative %: 71.3 % (ref 43.0–77.0)
PLATELETS: 271 10*3/uL (ref 150.0–400.0)
RBC: 4.69 Mil/uL (ref 3.87–5.11)
RDW: 13 % (ref 11.5–15.5)
WBC: 8.1 10*3/uL (ref 4.0–10.5)

## 2015-11-09 LAB — BASIC METABOLIC PANEL
BUN: 13 mg/dL (ref 6–23)
CO2: 32 meq/L (ref 19–32)
Calcium: 9.6 mg/dL (ref 8.4–10.5)
Chloride: 101 mEq/L (ref 96–112)
Creatinine, Ser: 0.76 mg/dL (ref 0.40–1.20)
GFR: 82.78 mL/min (ref >60.00)
Glucose, Bld: 118 mg/dL — ABNORMAL HIGH (ref 70–99)
POTASSIUM: 3.9 meq/L (ref 3.5–5.1)
Sodium: 141 mEq/L (ref 135–145)

## 2015-11-09 LAB — HEPATIC FUNCTION PANEL
ALBUMIN: 4.2 g/dL (ref 3.5–5.2)
ALK PHOS: 50 U/L (ref 39–117)
ALT: 18 U/L (ref 0–35)
AST: 18 U/L (ref 0–37)
BILIRUBIN DIRECT: 0.1 mg/dL (ref 0.0–0.3)
Total Bilirubin: 0.5 mg/dL (ref 0.2–1.2)
Total Protein: 6.9 g/dL (ref 6.0–8.3)

## 2015-11-09 LAB — TSH: TSH: 1.57 u[IU]/mL (ref 0.35–4.50)

## 2015-11-11 ENCOUNTER — Telehealth: Payer: Self-pay | Admitting: Internal Medicine

## 2015-11-11 NOTE — Telephone Encounter (Signed)
Notes Recorded by Waymon Budge, MD on 11/09/2015 at 5:01 PM Labs- non-fasting glucose just a little elevated. Other labs for thyroid, blood counts, liver and kidney function were ok. Notes Recorded by Waymon Budge, MD on 11/09/2015 at 4:56 PM CT chest shows changes of COPD as expected, and atherosclerosis in the coronary arteries. There is more advanced chronic bronchitis scarring in some areas, called 'bronchiectasis". We will discuss at next ov. -------------------------------  Spoke with pt, aware of results/recs.  Nothing further needed.

## 2015-12-07 ENCOUNTER — Telehealth: Payer: Self-pay | Admitting: Internal Medicine

## 2015-12-07 DIAGNOSIS — K920 Hematemesis: Secondary | ICD-10-CM

## 2015-12-07 NOTE — Telephone Encounter (Signed)
Per Dr. Maple HudsonYoung:  Patient needs to see GI to handle vomiting of blood.  GI referral placed. Do not think 3mm lung nodule is cause of blood and recommend patient keep her appointment on 03/06/16 for re-evaluation.  ----------------- Patient notified of Dr. Roxy CedarYoung's recommendations. Order entered for GI referral. Nothing further needed.

## 2015-12-07 NOTE — Telephone Encounter (Signed)
Spoke with pt, states she vomited blood yesterday, [facility]-states her CT chest showed a 3mm nodule in her RML.  Pt also c/o weakness, unexplained weight loss.  Pt has not vomited since yesterday.  Pt requesting further recs from [facility].   Last ov: 11/04/15 Next ov: 03/06/16  CY please advise.  Thanks.   Allergies  Allergen Reactions  . Other     Darvocet-"deathly" sick  . Flovent [Fluticasone]     Burning in chest  . Fluoxetine Hcl Other (See Comments)    unknown  . Nubain [Nalbuphine Hcl]   . Prednisone     Unsure of reaction   Current Outpatient Prescriptions on File Prior to Visit  Medication Sig Dispense Refill  . albuterol (VENTOLIN HFA) 108 (90 BASE) MCG/ACT inhaler Inhale 2 puffs into the lungs every 6 (six) hours as needed for wheezing or shortness of breath.    . calcium carbonate (OS-CAL) 600 MG TABS tablet Take 600 mg by mouth 2 (two) times daily with a meal.    . cetirizine (ZYRTEC) 10 MG tablet Take 10 mg by mouth daily.    . cholecalciferol (VITAMIN D) 1000 UNITS tablet Take 1,000 Units by mouth daily.    . clonazePAM (KLONOPIN) 0.5 MG tablet Take 0.5 mg by mouth every 8 (eight) hours as needed for anxiety.    Marland Kitchen. esomeprazole (NEXIUM) 40 MG capsule Take 40 mg by mouth daily at 12 noon.    Marland Kitchen. estradiol (ESTRACE) 2 MG tablet Take 1 tablet by mouth daily.  12  . fentaNYL (DURAGESIC - DOSED MCG/HR) 100 MCG/HR Place 200 mcg onto the skin every 3 (three) days.    Marland Kitchen. HYDROcodone-acetaminophen (LORTAB) 7.5-500 MG per tablet Take 1 tablet by mouth every 6 (six) hours as needed for pain.    . hydroxychloroquine (PLAQUENIL) 200 MG tablet Take 300 mg by mouth daily.    Marland Kitchen. LINZESS 145 MCG CAPS capsule Take 1 capsule by mouth daily. Prn constipation    . Multiple Vitamin (MULTIVITAMIN) tablet Take 1 tablet by mouth daily.    . ondansetron (ZOFRAN-ODT) 8 MG disintegrating tablet Take 8 mg by mouth 2 (two) times daily as needed for nausea or vomiting.    . sertraline (ZOLOFT) 100  MG tablet Take 200 mg by mouth daily.    . traZODone (DESYREL) 50 MG tablet Take 2 tablets per night as needed     No current facility-administered medications on file prior to visit.

## 2015-12-14 ENCOUNTER — Ambulatory Visit: Payer: 59 | Admitting: Gastroenterology

## 2015-12-14 ENCOUNTER — Encounter: Payer: Self-pay | Admitting: Gastroenterology

## 2015-12-14 ENCOUNTER — Ambulatory Visit (INDEPENDENT_AMBULATORY_CARE_PROVIDER_SITE_OTHER): Payer: Medicare Other | Admitting: Gastroenterology

## 2015-12-14 ENCOUNTER — Other Ambulatory Visit (INDEPENDENT_AMBULATORY_CARE_PROVIDER_SITE_OTHER): Payer: Medicare Other

## 2015-12-14 VITALS — BP 90/60 | HR 100 | Ht 64.0 in | Wt 92.2 lb

## 2015-12-14 DIAGNOSIS — R1013 Epigastric pain: Secondary | ICD-10-CM

## 2015-12-14 DIAGNOSIS — R197 Diarrhea, unspecified: Secondary | ICD-10-CM | POA: Diagnosis not present

## 2015-12-14 DIAGNOSIS — R112 Nausea with vomiting, unspecified: Secondary | ICD-10-CM

## 2015-12-14 DIAGNOSIS — R634 Abnormal weight loss: Secondary | ICD-10-CM

## 2015-12-14 LAB — IGA: IGA: 210 mg/dL (ref 68–378)

## 2015-12-14 NOTE — Patient Instructions (Signed)
Please go to the basement level to have your labs drawn and stool study. You have been scheduled for an endoscopy. Please follow written instructions given to you at your visit today. If you use inhalers (even only as needed), please bring them with you on the day of your procedure. Your physician has requested that you go to www.[email or web address].com and enter the access code given to you at your visit today. This web site gives a general overview about your procedure. However, you should still follow specific instructions given to you by our office regarding your preparation for the procedure.

## 2015-12-15 ENCOUNTER — Telehealth: Payer: Self-pay | Admitting: *Deleted

## 2015-12-15 LAB — TISSUE TRANSGLUTAMINASE, IGG: TISSUE TRANSGLUT AB: 1 U/mL (ref <6)

## 2015-12-15 NOTE — Telephone Encounter (Signed)
Received medical records we requested on 12-14-2015. EGD and Colon from 12-24-2013. Pathology reports included from Dr. Charm BargesButler, [facility].  Gave records to Doug SouJessica Zehr PA-C to review.

## 2015-12-22 ENCOUNTER — Other Ambulatory Visit: Payer: Medicare Other

## 2015-12-22 DIAGNOSIS — R112 Nausea with vomiting, unspecified: Secondary | ICD-10-CM

## 2015-12-22 DIAGNOSIS — R197 Diarrhea, unspecified: Secondary | ICD-10-CM

## 2015-12-22 DIAGNOSIS — R634 Abnormal weight loss: Secondary | ICD-10-CM

## 2015-12-22 DIAGNOSIS — R1013 Epigastric pain: Secondary | ICD-10-CM

## 2015-12-23 ENCOUNTER — Encounter: Payer: Self-pay | Admitting: Gastroenterology

## 2015-12-23 DIAGNOSIS — R112 Nausea with vomiting, unspecified: Secondary | ICD-10-CM | POA: Insufficient documentation

## 2015-12-23 DIAGNOSIS — R197 Diarrhea, unspecified: Secondary | ICD-10-CM | POA: Insufficient documentation

## 2015-12-23 DIAGNOSIS — R1013 Epigastric pain: Secondary | ICD-10-CM

## 2015-12-23 HISTORY — DX: Nausea with vomiting, unspecified: R11.2

## 2015-12-23 HISTORY — DX: Epigastric pain: R10.13

## 2015-12-23 HISTORY — DX: Diarrhea, unspecified: R19.7

## 2015-12-23 LAB — OVA AND PARASITE EXAMINATION: OP: NONE SEEN

## 2015-12-23 NOTE — Progress Notes (Signed)
i agree with the above note, plan 

## 2015-12-23 NOTE — Progress Notes (Signed)
12/14/2015 Breanna Hughes [id number] 1955/11/13   HISTORY OF PRESENT ILLNESS:  This is a 60 year old female with PMH of lupus, IBS, GERD, depression/anxiety, CAD, COPD, fibromyalgia, and history of cerebral aneurysm repair.  She presents to the office today at the request of her PCP, Dr. Nedra Hai, with several GI complaints.  Reports nausea and vomiting (which has been chronic, intermittent) with hematemesis, epigastric pain and a 35-40 pound weight loss.  Also reports that she feels weak.  Tells me that she was vomiting "chunks of blood".  Had a high resolution chest CT when she went to the ED for this on 11/09/15 (over one month ago).  CBC, BMP, TSH, and hepatic function panel were normal; Hgb was 14.7 grams.  Also complains of diarrhea and gas/bloating.  She apparently weighed 125-130 pounds in the spring of 2014, weighed 104 pounds in spring of 2015, and now today weighs 92 pounds, so the weight loss has been continuous over the past 3 years (all of her other symptoms have been intermittent for the past 3 years as well).  Apparently has a past history of an eating disorder (seen in previous GI notes).  She tells me that the vomiting and diarrhea is all episodic; starts with nausea and vomiting then diarrhea follows.  She thinks that she saw worms in her stool on one occasion (says that it looked like spaghetti noodles but she had not eaten spaghetti).  She is on Nexium 40 mg daily  She has GI history with Dr. Charm Barges in [place] and underwent EGD on 12/24/2013 at which time the study was normal with only some bile reflux noted. Duodenal biopsies were normal as well as gastric biopsies. She also underwent colonoscopy at the same time and had a normal colonoscopy to the cecum with "overall the prep was good, but there were numerous pieces of solid stool throughout the colon". The colonoscopy was performed for screening, but also complaints of weight loss. The endoscopy was also performed for nausea,  vomiting, and weight loss.   Past Medical History  Diagnosis Date  . Lupus (HCC)   . Nausea alone   . Loss of weight   . Edema   . Generalized osteoarthrosis, involving multiple sites   . Symptomatic menopausal or female climacteric states   . Hypertonicity of bladder   . Irritable bowel syndrome   . Unspecified constipation   . Esophageal reflux   . Chronic airway obstruction, not elsewhere classified   . Insomnia, unspecified   . Depressive disorder, not elsewhere classified   . Anxiety state, unspecified   . Arthritis   . Asthma   . CAD (coronary artery disease)   . COPD (chronic obstructive pulmonary disease) (HCC)   . Pneumonia   . Colon polyps   . Fibromyalgia    Past Surgical History  Procedure Laterality Date  . Lumbar disc surgery      x 3  . Total vaginal hysterectomy  1982  . Breast lumpectomy      x 2  . Appendectomy Left 1976  . Cerebral aneurysm repair      reports that she quit smoking about 6 years ago. Her smoking use included Cigarettes. She quit after 30 years of use. She has never used smokeless tobacco. She reports that she does not drink alcohol or use illicit drugs. family history includes Allergies in her brother and mother; Autoimmune disease in her brother; Breast cancer in her cousin and mother; Emphysema in her  father; Heart disease in her brother; Rheum arthritis in her maternal grandmother. Allergies  Allergen Reactions  . Other     Darvocet-"deathly" sick  . Flovent [Fluticasone]     Burning in chest  . Nubain [Nalbuphine Hcl]   . Prednisone     Unsure of reaction  . Prozac [Fluoxetine Hcl] Other (See Comments)    unknown      Outpatient Encounter Prescriptions as of 12/14/2015  Medication Sig  . albuterol (VENTOLIN HFA) 108 (90 BASE) MCG/ACT inhaler Inhale 2 puffs into the lungs every 6 (six) hours as needed for wheezing or shortness of breath.  . calcium carbonate (OS-CAL) 600 MG TABS tablet Take 600 mg by mouth 2 (two) times  daily with a meal.  . cetirizine (ZYRTEC) 10 MG tablet Take 10 mg by mouth daily.  . clonazePAM (KLONOPIN) 0.5 MG tablet Take 0.5 mg by mouth every 8 (eight) hours as needed for anxiety.  Marland Kitchen esomeprazole (NEXIUM) 40 MG capsule Take 40 mg by mouth daily at 12 noon.  Marland Kitchen estradiol (ESTRACE) 2 MG tablet Take 1 tablet by mouth daily.  . fentaNYL (DURAGESIC - DOSED MCG/HR) 100 MCG/HR Place 200 mcg onto the skin every 3 (three) days.  Marland Kitchen HYDROcodone-acetaminophen (LORTAB) 7.5-500 MG per tablet Take 1 tablet by mouth every 6 (six) hours as needed for pain.  . hydroxychloroquine (PLAQUENIL) 200 MG tablet Take 300 mg by mouth daily.  . Multiple Vitamin (MULTIVITAMIN) tablet Take 1 tablet by mouth daily.  . ondansetron (ZOFRAN-ODT) 8 MG disintegrating tablet Take 8 mg by mouth 2 (two) times daily as needed for nausea or vomiting.  . sertraline (ZOLOFT) 100 MG tablet Take 100 mg by mouth daily.   . traZODone (DESYREL) 50 MG tablet Take 2 tablets per night as needed  . nitroGLYCERIN (NITROSTAT) 0.4 MG SL tablet Place 0.4 mg under the tongue every 5 (five) minutes as needed for chest pain. Reported on 12/14/2015  . [DISCONTINUED] cholecalciferol (VITAMIN D) 1000 UNITS tablet Take 1,000 Units by mouth daily.  . [DISCONTINUED] LINZESS 145 MCG CAPS capsule Take 1 capsule by mouth daily. Prn constipation   No facility-administered encounter medications on file as of 12/14/2015.    REVIEW OF SYSTEMS  : All other systems reviewed and negative except where noted in the History of Present Illness.   PHYSICAL EXAM: BP 90/60 mmHg  Pulse 100  Ht  (1.626 m)  Wt 92 lb 4 oz (41.844 kg)  BMI 15.83 kg/m2 General: Thin white female in no acute distress Head: Normocephalic and atraumatic Eyes:  Sclerae anicteric,conjunctiva pink. Ears: Normal auditory acuity Lungs: Clear throughout to auscultation Heart: Regular rate and rhythm Abdomen: Soft, non-distended.  BS present.  Mild epigastric TTP. Musculoskeletal:  Symmetrical with no gross deformities  Skin: No lesions on visible extremities Extremities: No edema  Neurological: Alert oriented x 4, grossly non-focal Psychological:  Alert and cooperative. Normal mood and affect  ASSESSMENT AND PLAN: -60 year old female with complaints of intermittent nausea, vomiting as well as weight loss for the past 3 years.  Total 35-40 pound weight loss total.  Remote history of eating disorder.  EGD negative in 2015 but in face of recent complaints of hematemesis and epigastric pain, for which she visited the ED, we will repeat EGD.  ? MWT, ulcer disease, etc.  Will be scheduled with Dr. Christella Hartigan.  The risks, benefits, and alternatives to EGD were discussed with the patient and she consents to proceed.  -Diarrhea:  Patient concerned that  she has "worms" or a parasite.  Will check stool O&P.  Will also check celiac labs, although previous duodenal biopsies were negative.  CC:  Simone CuriaLee, Keung, MD

## 2016-01-24 ENCOUNTER — Ambulatory Visit (AMBULATORY_SURGERY_CENTER): Payer: Medicare Other | Admitting: Gastroenterology

## 2016-01-24 ENCOUNTER — Telehealth: Payer: Self-pay | Admitting: Gastroenterology

## 2016-01-24 ENCOUNTER — Encounter: Payer: Self-pay | Admitting: Gastroenterology

## 2016-01-24 VITALS — BP 116/73 | HR 91 | Temp 98.9°F | Resp 16 | Ht 64.0 in | Wt 92.0 lb

## 2016-01-24 DIAGNOSIS — R1115 Cyclical vomiting syndrome unrelated to migraine: Secondary | ICD-10-CM

## 2016-01-24 DIAGNOSIS — G43A1 Cyclical vomiting, intractable: Secondary | ICD-10-CM

## 2016-01-24 DIAGNOSIS — R112 Nausea with vomiting, unspecified: Secondary | ICD-10-CM | POA: Diagnosis not present

## 2016-01-24 HISTORY — PX: ABDOMINAL HYSTERECTOMY: SHX81

## 2016-01-24 MED ORDER — SODIUM CHLORIDE 0.9 % IV SOLN: 500.0000 mL | INTRAVENOUS | Status: DC

## 2016-01-24 NOTE — Patient Instructions (Signed)
Biopsies taken today. Gastritis seen, handout given.  Call us with any questions or concerns. Thank you!  YOU HAD AN ENDOSCOPIC PROCEDURE TODAY AT THE [facility]:   Refer to the procedure report that was given to you for any specific questions about what was found during the examination.  If the procedure report does not answer your questions, please call your gastroenterologist to clarify.  If you requested that your care partner not be given the details of your procedure findings, then the procedure report has been included in a sealed envelope for you to review at your convenience later.  YOU SHOULD EXPECT: Some feelings of bloating in the abdomen. Passage of more gas than usual.  Walking can help get rid of the air that was put into your GI tract during the procedure and reduce the bloating. If you had a lower endoscopy (such as a colonoscopy or flexible sigmoidoscopy) you may notice spotting of blood in your stool or on the toilet paper. If you underwent a bowel prep for your procedure, you may not have a normal bowel movement for a few days.  Please Note:  You might notice some irritation and congestion in your nose or some drainage.  This is from the oxygen used during your procedure.  There is no need for concern and it should clear up in a day or so.  SYMPTOMS TO REPORT IMMEDIATELY:    Following upper endoscopy (EGD)  Vomiting of blood or coffee ground material  New chest pain or pain under the shoulder blades  Painful or persistently difficult swallowing  New shortness of breath  Fever of 100F or higher  Black, tarry-looking stools  For urgent or emergent issues, a gastroenterologist can be reached at any hour by calling [phone].   DIET: Your first meal following the procedure should be a small meal and then it is ok to progress to your normal diet. Heavy or fried foods are harder to digest and may make you feel nauseous or bloated.  Likewise, meals heavy in  dairy and vegetables can increase bloating.  Drink plenty of fluids but you should avoid alcoholic beverages for 24 hours.  ACTIVITY:  You should plan to take it easy for the rest of today and you should NOT DRIVE or use heavy machinery until tomorrow (because of the sedation medicines used during the test).    FOLLOW UP: Our staff will call the number listed on your records the next business day following your procedure to check on you and address any questions or concerns that you may have regarding the information given to you following your procedure. If we do not reach you, we will leave a message.  However, if you are feeling well and you are not experiencing any problems, there is no need to return our call.  We will assume that you have returned to your regular daily activities without incident.  If any biopsies were taken you will be contacted by phone or by letter within the next 1-3 weeks.  Please call us at [phone] if you have not heard about the biopsies in 3 weeks.    SIGNATURES/CONFIDENTIALITY: You and/or your care partner have signed paperwork which will be entered into your electronic medical record.  These signatures attest to the fact that that the information above on your After Visit Summary has been reviewed and is understood.  Full responsibility of the confidentiality of this discharge information lies with you and/or your care-partner. 

## 2016-01-24 NOTE — Op Note (Signed)
[facility] Patient Name: Breanna MassedDianna Blondin Procedure Date: 01/24/2016 1:23 PM MRN: [id number] Endoscopist: Rachael Feeaniel P Jacobs , MD Age: 60 Date of Birth: 01/10/1956 Gender: Female Procedure:                Upper GI endoscopy Indications:              Nausea with vomiting; weight loss Medicines:                Monitored Anesthesia Care Procedure:                Pre-Anesthesia Assessment:                           - Prior to the procedure, a History and Physical                            was performed, and patient medications and                            allergies were reviewed. The patient's tolerance of                            previous anesthesia was also reviewed. The risks                            and benefits of the procedure and the sedation                            options and risks were discussed with the patient.                            All questions were answered, and informed consent                            was obtained. Prior Anticoagulants: The patient has                            taken no previous anticoagulant or antiplatelet                            agents. ASA Grade Assessment: II - A patient with                            mild systemic disease. After reviewing the risks                            and benefits, the patient was deemed in                            satisfactory condition to undergo the procedure.                           After obtaining informed consent, the endoscope was  passed under direct vision. Throughout the                            procedure, the patient's blood pressure, pulse, and                            oxygen saturations were monitored continuously. The                            Model GIF-HQ190 [id number]) scope was introduced                            through the mouth, and advanced to the second part                            of duodenum. The upper GI endoscopy was           accomplished without difficulty. The patient                            tolerated the procedure well. Scope In: Scope Out: Findings:                 The esophagus was normal.                           Localized mild inflammation characterized by                            granularity was found in the gastric antrum.                            Biopsies were taken with a cold forceps for                            histology.                           The examined duodenum was normal. Complications:            No immediate complications. Estimated blood loss:                            None. Estimated Blood Loss:     Estimated blood loss: none. Impression:               - Normal esophagus.                           - Mild, non-specific gastritis. Biopsied.                           - Normal examined duodenum. Recommendation:           - Patient has a contact number available for                            emergencies. The signs and symptoms of potential  delayed complications were discussed with the                            patient. Return to normal activities tomorrow.                            Written discharge instructions were provided to the                            patient.                           - Resume previous diet.                           - Continue present medications.                           - Await pathology results. If these show H. pylori                            you will be started on appropriate antibiotics. If                            not, then will likely arrange CT scan abd/pelvis to                            continue to work up your GI symptoms. Rachael Fee, MD 01/24/2016 1:42:14 PM This report has been signed electronically.

## 2016-01-24 NOTE — Progress Notes (Signed)
Called to room to assist during endoscopic procedure.  Patient ID and intended procedure confirmed with present staff. Received instructions for my participation in the procedure from the performing physician.  

## 2016-01-24 NOTE — Telephone Encounter (Signed)
Spoke with patient. Encouraged patient to eat,drink and rest also take something for pain as she normally would. Encouraged patient to call us back using 24 hr # if symptoms worsen. She verbalizes understanding.

## 2016-01-24 NOTE — Progress Notes (Signed)
Report to PACU, RN, vss, BBS= Clear.  

## 2016-01-25 ENCOUNTER — Telehealth: Payer: Self-pay | Admitting: *Deleted

## 2016-01-25 ENCOUNTER — Telehealth: Payer: Self-pay | Admitting: Gastroenterology

## 2016-01-25 NOTE — Telephone Encounter (Signed)
Pt has been having sharp upper abd pain slightly to the right under the ribs, the pain started today and had an endo yesterday.  Has had 2 normal bowel movements today.  She says her abdomen feels hot to the touch.  No nausea, no vomiting, no bleeding.  The pain is a constant aching and becomes sharp at times.  Very tender.  She is passing gas, she states it does not feel like gas pains.  She states it is hard to catch her breath because of the pain.  I spoke with Dr Leone PayorGessner and he states that it is doubtful that this is procedure related.  She is to go to the ED if the pain becomes severe or follow up with PCP if pain does not resolve.

## 2016-01-25 NOTE — Telephone Encounter (Signed)
  Follow up Call-  Call back number 01/24/2016  Post procedure Call Back phone  # [phone]  Permission to leave phone message Yes     Patient questions:  Do you have a fever, pain , or abdominal swelling? No. Pain Score  0 *  Have you tolerated food without any problems? Yes.    Have you been able to return to your normal activities? Yes.    Do you have any questions about your discharge instructions: Diet   No. Medications  No. Follow up visit  No.  Do you have questions or concerns about your Care? No.  Actions: * If pain score is 4 or above: No action needed, pain <4.

## 2016-02-01 ENCOUNTER — Other Ambulatory Visit: Payer: Self-pay

## 2016-02-01 DIAGNOSIS — R634 Abnormal weight loss: Secondary | ICD-10-CM

## 2016-02-01 DIAGNOSIS — R112 Nausea with vomiting, unspecified: Secondary | ICD-10-CM

## 2016-02-01 DIAGNOSIS — R197 Diarrhea, unspecified: Secondary | ICD-10-CM

## 2016-02-03 ENCOUNTER — Other Ambulatory Visit (INDEPENDENT_AMBULATORY_CARE_PROVIDER_SITE_OTHER): Payer: Medicare Other

## 2016-02-03 DIAGNOSIS — R112 Nausea with vomiting, unspecified: Secondary | ICD-10-CM

## 2016-02-03 DIAGNOSIS — R197 Diarrhea, unspecified: Secondary | ICD-10-CM | POA: Diagnosis not present

## 2016-02-03 DIAGNOSIS — R634 Abnormal weight loss: Secondary | ICD-10-CM | POA: Diagnosis not present

## 2016-02-03 LAB — BUN: BUN: 16 mg/dL (ref 6–23)

## 2016-02-03 LAB — CREATININE, SERUM: CREATININE: 0.77 mg/dL (ref 0.40–1.20)

## 2016-02-08 ENCOUNTER — Ambulatory Visit (HOSPITAL_COMMUNITY)
Admission: RE | Admit: 2016-02-08 | Discharge: 2016-02-08 | Disposition: A | Discharge status: Home / Self Care | Payer: Medicare Other | Source: Ambulatory Visit | Attending: Gastroenterology | Admitting: Gastroenterology

## 2016-02-08 ENCOUNTER — Encounter (HOSPITAL_COMMUNITY): Payer: Self-pay

## 2016-02-08 DIAGNOSIS — J432 Centrilobular emphysema: Secondary | ICD-10-CM | POA: Insufficient documentation

## 2016-02-08 DIAGNOSIS — R918 Other nonspecific abnormal finding of lung field: Secondary | ICD-10-CM | POA: Diagnosis not present

## 2016-02-08 DIAGNOSIS — R634 Abnormal weight loss: Secondary | ICD-10-CM

## 2016-02-08 DIAGNOSIS — K5641 Fecal impaction: Secondary | ICD-10-CM | POA: Diagnosis not present

## 2016-02-08 DIAGNOSIS — R197 Diarrhea, unspecified: Secondary | ICD-10-CM | POA: Diagnosis present

## 2016-02-08 DIAGNOSIS — R112 Nausea with vomiting, unspecified: Secondary | ICD-10-CM

## 2016-02-08 MED ORDER — IOPAMIDOL (ISOVUE-300) INJECTION 61%
100.0000 mL | Freq: Once | INTRAVENOUS | Status: AC | PRN
  Administered 2016-02-08: 100 mL via INTRAVENOUS

## 2016-03-06 ENCOUNTER — Encounter: Payer: Self-pay | Admitting: Internal Medicine

## 2016-03-06 ENCOUNTER — Ambulatory Visit: Payer: Medicare Other | Admitting: Internal Medicine

## 2016-03-06 DIAGNOSIS — J449 Chronic obstructive pulmonary disease, unspecified: Secondary | ICD-10-CM | POA: Diagnosis not present

## 2016-03-06 MED ORDER — ALBUTEROL SULFATE (2.5 MG/3ML) 0.083% IN NEBU: 2.5000 mg | INHALATION_SOLUTION | Freq: Four times a day (QID) | RESPIRATORY_TRACT | Status: DC | PRN

## 2016-03-06 MED ORDER — COMPRESSOR NEBULIZER MISC: 1.0000 | Freq: Once | Status: AC

## 2016-03-06 NOTE — Patient Instructions (Signed)
Sample x 2 Anoro    Inhale 1 puff, once daily  Scripts printed for Medicare DME- nebulizer machine and albuterol neb solution to use if needed

## 2016-03-06 NOTE — Progress Notes (Signed)
F self referred for c/o COPD and chronic bronchitis complicated by hx cerebral artery  aneurysm, depression   11/04/2015-60 year old female former smoker followed for COPD/chronic bronchitis, rhinitis, complicated by SLE, GERD Sputum cultures 11/12/2014-Normal Flora, + Nocardia FOLLOWS FOR:Pt having hard time eating and keeping weight on; continues to have increased congestion. Morning cough is productive. Respiratory status really not changed. Her main concern is ongoing weight loss. Occasional soaking night sweats. Colonoscopy 1 year ago by Dr. Charm Barges in [place] but no nutrition workup. Not much aware of reflux in recent years.  03/06/2016-60 year old female former smoker followed for COPD/chronic bronchitis, rhinitis, complicated by SLE, GERD FOLLOWS FOR:Pt feels like her breathing has gotten worse-lightheaded and weak. Increased cough productive white without fever or chest pain.  CT 11/09/2015 chest-images reviewed - IMPRESSION: 1. No evidence of interstitial lung disease. 2. Moderate to severe centrilobular emphysema and diffuse bronchial wall thickening, in keeping with the provided history of COPD. 3. Minimal scattered cylindrical bronchiectasis in the mid to lower lungs. No tree-in-bud opacities to suggest active bronchiolitis. 4. Coronary atherosclerosis . Electronically Signed  By: Delbert Phenix M.D.  On: 11/09/2015 16:43T chest 11/09/15 Normal alpha-1-MM 160  ROS-see HPI Constitutional:   +weight loss, +night sweats, fevers, chills, fatigue, lassitude. HEENT:   No-  headaches, difficulty swallowing, tooth/dental problems, sore throat,       No-  sneezing, itching, ear ache, nasal congestion, post nasal drip,  CV:  +chest pain, no- orthopnea, PND, swelling in lower extremities, anasarca,                                                            dizziness, palpitations Resp:+shortness of breath with exertion or at rest.              + productive cough,  No non-productive  cough,  No- coughing up of blood.               change in color of mucus.  No- wheezing.   Skin: No-   rash or lesions. GI:  No-   heartburn, indigestion, abdominal pain, nausea, vomiting,  GU:  MS:  +joint pain or swelling.  No- decreased range of motion.  + back pain. Neuro-     nothing unusual Psych:  No- change in mood or affect. No depression or anxiety.  No memory loss.  OBJ- Physical Exam General- Alert, Oriented, Affect-appropriate, Distress- none acute, + thin Skin- rash-none, lesions- none, excoriation- none. Piercings Lymphadenopathy- none Head- atraumatic            Eyes- Gross vision intact, PERRLA, conjunctivae and secretions clear            Ears- Hearing, canals-normal            Nose- Clear, no-Septal dev, mucus, polyps, erosion, perforation             Throat- Mallampati II , mucosa clear , drainage- none, tonsils- atrophic,                       Neck- flexible , trachea midline, no stridor , thyroid nl, carotid no bruit Chest - symmetrical excursion , unlabored           Heart/CV- RRR , no murmur , no gallop  , no rub, nl  s1 s2                           - JVD- none , edema- none, stasis changes- none, varices- none           Lung- clear to P&A, wheeze- none, + slight dry , dullness-none, rub- none           Chest wall-  Abd-  Br/ Gen/ Rectal- Not done, not indicated Extrem- cyanosis- none, clubbing, none, atrophy- none, strength- nl.  Neuro- grossly intact to observation

## 2016-08-06 NOTE — Assessment & Plan Note (Signed)
Chest CT does not show evidence of Mycobacterium avium infection, but does show emphysema, chronic bronchitis and bronchiectasis Plan-try samples of Anoro and provide nebulizer machine with education done.

## 2016-08-07 ENCOUNTER — Ambulatory Visit: Payer: Medicare Other | Admitting: Internal Medicine

## 2016-10-23 DIAGNOSIS — F329 Major depressive disorder, single episode, unspecified: Secondary | ICD-10-CM | POA: Diagnosis not present

## 2016-10-23 DIAGNOSIS — K589 Irritable bowel syndrome without diarrhea: Secondary | ICD-10-CM | POA: Diagnosis not present

## 2016-10-23 DIAGNOSIS — R600 Localized edema: Secondary | ICD-10-CM | POA: Diagnosis not present

## 2016-10-23 DIAGNOSIS — L930 Discoid lupus erythematosus: Secondary | ICD-10-CM | POA: Diagnosis not present

## 2016-10-23 DIAGNOSIS — N318 Other neuromuscular dysfunction of bladder: Secondary | ICD-10-CM | POA: Diagnosis not present

## 2016-10-23 DIAGNOSIS — J449 Chronic obstructive pulmonary disease, unspecified: Secondary | ICD-10-CM | POA: Diagnosis not present

## 2016-10-23 DIAGNOSIS — K219 Gastro-esophageal reflux disease without esophagitis: Secondary | ICD-10-CM | POA: Diagnosis not present

## 2016-10-23 DIAGNOSIS — F411 Generalized anxiety disorder: Secondary | ICD-10-CM | POA: Diagnosis not present

## 2016-10-23 DIAGNOSIS — R945 Abnormal results of liver function studies: Secondary | ICD-10-CM | POA: Diagnosis not present

## 2016-10-23 DIAGNOSIS — J208 Acute bronchitis due to other specified organisms: Secondary | ICD-10-CM | POA: Diagnosis not present

## 2016-10-23 DIAGNOSIS — R110 Nausea: Secondary | ICD-10-CM | POA: Diagnosis not present

## 2016-10-23 DIAGNOSIS — F5101 Primary insomnia: Secondary | ICD-10-CM | POA: Diagnosis not present

## 2016-11-06 DIAGNOSIS — K589 Irritable bowel syndrome without diarrhea: Secondary | ICD-10-CM | POA: Diagnosis not present

## 2016-11-06 DIAGNOSIS — K219 Gastro-esophageal reflux disease without esophagitis: Secondary | ICD-10-CM | POA: Diagnosis not present

## 2016-11-06 DIAGNOSIS — J028 Acute pharyngitis due to other specified organisms: Secondary | ICD-10-CM | POA: Diagnosis not present

## 2016-11-06 DIAGNOSIS — R600 Localized edema: Secondary | ICD-10-CM | POA: Diagnosis not present

## 2016-11-06 DIAGNOSIS — R945 Abnormal results of liver function studies: Secondary | ICD-10-CM | POA: Diagnosis not present

## 2016-11-06 DIAGNOSIS — N318 Other neuromuscular dysfunction of bladder: Secondary | ICD-10-CM | POA: Diagnosis not present

## 2016-11-06 DIAGNOSIS — F5101 Primary insomnia: Secondary | ICD-10-CM | POA: Diagnosis not present

## 2016-11-06 DIAGNOSIS — F329 Major depressive disorder, single episode, unspecified: Secondary | ICD-10-CM | POA: Diagnosis not present

## 2016-11-06 DIAGNOSIS — Z79891 Long term (current) use of opiate analgesic: Secondary | ICD-10-CM | POA: Diagnosis not present

## 2016-11-06 DIAGNOSIS — L930 Discoid lupus erythematosus: Secondary | ICD-10-CM | POA: Diagnosis not present

## 2016-11-06 DIAGNOSIS — J449 Chronic obstructive pulmonary disease, unspecified: Secondary | ICD-10-CM | POA: Diagnosis not present

## 2016-11-06 DIAGNOSIS — R05 Cough: Secondary | ICD-10-CM | POA: Diagnosis not present

## 2016-11-06 DIAGNOSIS — J208 Acute bronchitis due to other specified organisms: Secondary | ICD-10-CM | POA: Diagnosis not present

## 2016-11-06 DIAGNOSIS — R110 Nausea: Secondary | ICD-10-CM | POA: Diagnosis not present

## 2016-11-14 DIAGNOSIS — J449 Chronic obstructive pulmonary disease, unspecified: Secondary | ICD-10-CM | POA: Diagnosis not present

## 2016-11-14 DIAGNOSIS — K589 Irritable bowel syndrome without diarrhea: Secondary | ICD-10-CM | POA: Diagnosis not present

## 2016-11-14 DIAGNOSIS — F411 Generalized anxiety disorder: Secondary | ICD-10-CM | POA: Diagnosis not present

## 2016-11-14 DIAGNOSIS — R110 Nausea: Secondary | ICD-10-CM | POA: Diagnosis not present

## 2016-11-14 DIAGNOSIS — R945 Abnormal results of liver function studies: Secondary | ICD-10-CM | POA: Diagnosis not present

## 2016-11-14 DIAGNOSIS — N318 Other neuromuscular dysfunction of bladder: Secondary | ICD-10-CM | POA: Diagnosis not present

## 2016-11-14 DIAGNOSIS — K219 Gastro-esophageal reflux disease without esophagitis: Secondary | ICD-10-CM | POA: Diagnosis not present

## 2016-11-14 DIAGNOSIS — F5101 Primary insomnia: Secondary | ICD-10-CM | POA: Diagnosis not present

## 2016-11-14 DIAGNOSIS — F329 Major depressive disorder, single episode, unspecified: Secondary | ICD-10-CM | POA: Diagnosis not present

## 2016-11-14 DIAGNOSIS — R600 Localized edema: Secondary | ICD-10-CM | POA: Diagnosis not present

## 2016-11-14 DIAGNOSIS — J208 Acute bronchitis due to other specified organisms: Secondary | ICD-10-CM | POA: Diagnosis not present

## 2016-11-14 DIAGNOSIS — L930 Discoid lupus erythematosus: Secondary | ICD-10-CM | POA: Diagnosis not present

## 2016-11-20 DIAGNOSIS — R600 Localized edema: Secondary | ICD-10-CM | POA: Diagnosis not present

## 2016-11-20 DIAGNOSIS — K589 Irritable bowel syndrome without diarrhea: Secondary | ICD-10-CM | POA: Diagnosis not present

## 2016-11-20 DIAGNOSIS — L930 Discoid lupus erythematosus: Secondary | ICD-10-CM | POA: Diagnosis not present

## 2016-11-20 DIAGNOSIS — F5101 Primary insomnia: Secondary | ICD-10-CM | POA: Diagnosis not present

## 2016-11-20 DIAGNOSIS — M159 Polyosteoarthritis, unspecified: Secondary | ICD-10-CM | POA: Diagnosis not present

## 2016-11-20 DIAGNOSIS — F329 Major depressive disorder, single episode, unspecified: Secondary | ICD-10-CM | POA: Diagnosis not present

## 2016-11-20 DIAGNOSIS — J449 Chronic obstructive pulmonary disease, unspecified: Secondary | ICD-10-CM | POA: Diagnosis not present

## 2016-11-20 DIAGNOSIS — R945 Abnormal results of liver function studies: Secondary | ICD-10-CM | POA: Diagnosis not present

## 2016-11-20 DIAGNOSIS — R05 Cough: Secondary | ICD-10-CM | POA: Diagnosis not present

## 2016-11-20 DIAGNOSIS — N318 Other neuromuscular dysfunction of bladder: Secondary | ICD-10-CM | POA: Diagnosis not present

## 2016-11-20 DIAGNOSIS — K219 Gastro-esophageal reflux disease without esophagitis: Secondary | ICD-10-CM | POA: Diagnosis not present

## 2016-11-20 DIAGNOSIS — F411 Generalized anxiety disorder: Secondary | ICD-10-CM | POA: Diagnosis not present

## 2016-11-23 ENCOUNTER — Ambulatory Visit: Payer: Medicare Other | Admitting: Internal Medicine

## 2016-11-27 ENCOUNTER — Encounter: Payer: Self-pay | Admitting: Internal Medicine

## 2016-11-27 ENCOUNTER — Ambulatory Visit (INDEPENDENT_AMBULATORY_CARE_PROVIDER_SITE_OTHER): Payer: PPO | Admitting: Internal Medicine

## 2016-11-27 VITALS — BP 132/80 | HR 110 | Ht 64.0 in | Wt 94.0 lb

## 2016-11-27 DIAGNOSIS — L930 Discoid lupus erythematosus: Secondary | ICD-10-CM | POA: Diagnosis not present

## 2016-11-27 DIAGNOSIS — J441 Chronic obstructive pulmonary disease with (acute) exacerbation: Secondary | ICD-10-CM | POA: Diagnosis not present

## 2016-11-27 DIAGNOSIS — J449 Chronic obstructive pulmonary disease, unspecified: Secondary | ICD-10-CM

## 2016-11-27 DIAGNOSIS — R071 Chest pain on breathing: Secondary | ICD-10-CM

## 2016-11-27 MED ORDER — METHYLPREDNISOLONE ACETATE 80 MG/ML IJ SUSP
80.0000 mg | Freq: Once | INTRAMUSCULAR | Status: AC
  Administered 2016-11-27: 80 mg via INTRAMUSCULAR

## 2016-11-27 MED ORDER — FLUTICASONE-UMECLIDIN-VILANT 100-62.5-25 MCG/INH IN AEPB: 1.0000 | INHALATION_SPRAY | Freq: Every day | RESPIRATORY_TRACT | 0 refills | Status: DC

## 2016-11-27 MED ORDER — LEVALBUTEROL HCL 0.63 MG/3ML IN NEBU
0.6300 mg | INHALATION_SOLUTION | Freq: Once | RESPIRATORY_TRACT | Status: AC
  Administered 2016-11-27: 0.63 mg via RESPIRATORY_TRACT

## 2016-11-27 NOTE — Patient Instructions (Signed)
Order- neb xop 0.63   Dx exacerb COPD  Depo 80  [facility]   Order- schedule CT chest no contrast    Dx COPD mixed type, Lupus  Sample Trelegy     Inhale 1 puff, once daily then rinse mouth

## 2016-11-27 NOTE — Progress Notes (Signed)
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 Increased cough productive white without fever or chest pain.  CT 11/09/2015 chest-images reviewed - IMPRESSION: 1. No evidence of interstitial lung disease. 2. Moderate to severe centrilobular emphysema and diffuse bronchial wall thickening, in keeping with the provided history of COPD. 3. Minimal scattered cylindrical bronchiectasis in the mid to lower lungs. No tree-in-bud opacities to suggest active bronchiolitis. 4. Coronary atherosclerosis . Electronically Signed  By: Delbert PhenixJason A Poff M.D.  On: 11/09/2015 16:43T chest 11/09/15  11/27/2016- 61--year-old female former smoker followed for COPD/chronic bronchitis, rhinitis, complicated by SLE, GERD FOLLOWS FOR: Pt states she has had several episodes of chest congestion and Dr Nedra HaiLee has put her on several different abx's and no help. Pt states she hurts in her chest and was told no PNA through CXR. Describes cough over 2 months, productive. Antibiotics including Avelox and doxycycline "no effect" except that cough is no longer productive [facility] We don't have that report . Occasional wheeze and shortness of breath. Doesn't recognize postnasal drip  Sore through sternum to back. Night sweats 1 year. History of lupus and back surgeries-fentanyl patch  ROS-see HPI Constitutional:   +weight loss, +night sweats, fevers, chills, fatigue, lassitude. HEENT:   No-  headaches, difficulty swallowing, tooth/dental problems, sore throat,       No-  sneezing,  itching, ear ache, nasal congestion, post nasal drip,  CV:  +chest pain, no- orthopnea, PND, swelling in lower extremities, anasarca,                                                            dizziness, palpitations Resp:+shortness of breath with exertion or at rest.              + productive cough,  + non-productive cough,  No- coughing up of blood.               change in color of mucus.  No- wheezing.   Skin: No-   rash or lesions. GI:  No-   heartburn, indigestion, abdominal pain, nausea, vomiting,  GU:  MS:  +joint pain or swelling.  No- decreased range of motion.  + back pain. Neuro-     nothing unusual Psych:  No- change in mood or affect. No depression or anxiety.  No memory loss.  OBJ- Physical Exam General- Alert, Oriented, Affect-appropriate, Distress- none acute, + thin Skin- rash-none, lesions- none, excoriation- none. Piercings Lymphadenopathy- none Head- atraumatic            Eyes- Gross vision intact, PERRLA, conjunctivae and secretions clear            Ears- Hearing, canals-normal            Nose- Clear, no-Septal dev, mucus, polyps, erosion, perforation             Throat- Mallampati II , mucosa clear , drainage- none, tonsils- atrophic,                       Neck- flexible , trachea  midline, no stridor , thyroid nl, carotid no bruit Chest - symmetrical excursion , unlabored           Heart/CV- RRR , no murmur , no gallop  , no rub, nl s1 s2                           - JVD- none , edema- none, stasis changes- none, varices- none           Lung- clear to P&A, wheeze- none, cough +  dry , dullness-none, rub- none           Chest wall-  Abd-  Br/ Gen/ Rectal- Not done, not indicated Extrem- cyanosis- none, clubbing, none, atrophy- none, strength- nl.  Neuro- grossly intact to observation

## 2016-12-03 NOTE — Assessment & Plan Note (Signed)
Noncardiac pain associated with prolonged coughing. Esophagitis is a consideration.

## 2016-12-03 NOTE — Assessment & Plan Note (Addendum)
Sustained bronchitic pattern exacerbation this winter seems finally to be getting better. Plan-[facility] are [facility]. Nebulizer treatments Xopenex, Depo-Medrol. I recommended CT chest because of persistent cough and former smoking history. Sample Trelegy inhaler for trial

## 2016-12-12 ENCOUNTER — Inpatient Hospital Stay: Admission: RE | Admit: 2016-12-12 | Payer: PPO | Source: Ambulatory Visit

## 2016-12-21 ENCOUNTER — Ambulatory Visit (INDEPENDENT_AMBULATORY_CARE_PROVIDER_SITE_OTHER)
Admission: RE | Admit: 2016-12-21 | Discharge: 2016-12-21 | Disposition: A | Discharge status: Home / Self Care | Payer: PPO | Source: Ambulatory Visit | Attending: Internal Medicine | Admitting: Internal Medicine

## 2016-12-21 ENCOUNTER — Encounter (INDEPENDENT_AMBULATORY_CARE_PROVIDER_SITE_OTHER): Payer: Self-pay

## 2016-12-21 DIAGNOSIS — J449 Chronic obstructive pulmonary disease, unspecified: Secondary | ICD-10-CM

## 2016-12-21 DIAGNOSIS — R05 Cough: Secondary | ICD-10-CM | POA: Diagnosis not present

## 2016-12-21 DIAGNOSIS — L930 Discoid lupus erythematosus: Secondary | ICD-10-CM

## 2016-12-26 DIAGNOSIS — J449 Chronic obstructive pulmonary disease, unspecified: Secondary | ICD-10-CM | POA: Diagnosis not present

## 2016-12-26 DIAGNOSIS — M159 Polyosteoarthritis, unspecified: Secondary | ICD-10-CM | POA: Diagnosis not present

## 2016-12-26 DIAGNOSIS — F5101 Primary insomnia: Secondary | ICD-10-CM | POA: Diagnosis not present

## 2016-12-26 DIAGNOSIS — N318 Other neuromuscular dysfunction of bladder: Secondary | ICD-10-CM | POA: Diagnosis not present

## 2016-12-26 DIAGNOSIS — R945 Abnormal results of liver function studies: Secondary | ICD-10-CM | POA: Diagnosis not present

## 2016-12-26 DIAGNOSIS — R5381 Other malaise: Secondary | ICD-10-CM | POA: Diagnosis not present

## 2016-12-26 DIAGNOSIS — K589 Irritable bowel syndrome without diarrhea: Secondary | ICD-10-CM | POA: Diagnosis not present

## 2016-12-26 DIAGNOSIS — F329 Major depressive disorder, single episode, unspecified: Secondary | ICD-10-CM | POA: Diagnosis not present

## 2016-12-26 DIAGNOSIS — F411 Generalized anxiety disorder: Secondary | ICD-10-CM | POA: Diagnosis not present

## 2016-12-26 DIAGNOSIS — R600 Localized edema: Secondary | ICD-10-CM | POA: Diagnosis not present

## 2016-12-26 DIAGNOSIS — K219 Gastro-esophageal reflux disease without esophagitis: Secondary | ICD-10-CM | POA: Diagnosis not present

## 2016-12-26 DIAGNOSIS — L930 Discoid lupus erythematosus: Secondary | ICD-10-CM | POA: Diagnosis not present

## 2017-01-25 DIAGNOSIS — L930 Discoid lupus erythematosus: Secondary | ICD-10-CM | POA: Diagnosis not present

## 2017-01-25 DIAGNOSIS — R945 Abnormal results of liver function studies: Secondary | ICD-10-CM | POA: Diagnosis not present

## 2017-01-25 DIAGNOSIS — J449 Chronic obstructive pulmonary disease, unspecified: Secondary | ICD-10-CM | POA: Diagnosis not present

## 2017-01-25 DIAGNOSIS — R600 Localized edema: Secondary | ICD-10-CM | POA: Diagnosis not present

## 2017-01-25 DIAGNOSIS — M159 Polyosteoarthritis, unspecified: Secondary | ICD-10-CM | POA: Diagnosis not present

## 2017-01-25 DIAGNOSIS — F329 Major depressive disorder, single episode, unspecified: Secondary | ICD-10-CM | POA: Diagnosis not present

## 2017-01-25 DIAGNOSIS — N318 Other neuromuscular dysfunction of bladder: Secondary | ICD-10-CM | POA: Diagnosis not present

## 2017-01-25 DIAGNOSIS — F5101 Primary insomnia: Secondary | ICD-10-CM | POA: Diagnosis not present

## 2017-01-25 DIAGNOSIS — Z1389 Encounter for screening for other disorder: Secondary | ICD-10-CM | POA: Diagnosis not present

## 2017-01-25 DIAGNOSIS — K219 Gastro-esophageal reflux disease without esophagitis: Secondary | ICD-10-CM | POA: Diagnosis not present

## 2017-01-25 DIAGNOSIS — K589 Irritable bowel syndrome without diarrhea: Secondary | ICD-10-CM | POA: Diagnosis not present

## 2017-01-25 DIAGNOSIS — J208 Acute bronchitis due to other specified organisms: Secondary | ICD-10-CM | POA: Diagnosis not present

## 2017-02-05 DIAGNOSIS — R600 Localized edema: Secondary | ICD-10-CM | POA: Diagnosis not present

## 2017-02-05 DIAGNOSIS — R636 Underweight: Secondary | ICD-10-CM | POA: Diagnosis not present

## 2017-02-05 DIAGNOSIS — Z681 Body mass index (BMI) 19 or less, adult: Secondary | ICD-10-CM | POA: Diagnosis not present

## 2017-02-05 DIAGNOSIS — R945 Abnormal results of liver function studies: Secondary | ICD-10-CM | POA: Diagnosis not present

## 2017-02-05 DIAGNOSIS — F329 Major depressive disorder, single episode, unspecified: Secondary | ICD-10-CM | POA: Diagnosis not present

## 2017-02-05 DIAGNOSIS — L930 Discoid lupus erythematosus: Secondary | ICD-10-CM | POA: Diagnosis not present

## 2017-02-05 DIAGNOSIS — J208 Acute bronchitis due to other specified organisms: Secondary | ICD-10-CM | POA: Diagnosis not present

## 2017-02-05 DIAGNOSIS — J449 Chronic obstructive pulmonary disease, unspecified: Secondary | ICD-10-CM | POA: Diagnosis not present

## 2017-02-05 DIAGNOSIS — K219 Gastro-esophageal reflux disease without esophagitis: Secondary | ICD-10-CM | POA: Diagnosis not present

## 2017-02-27 DIAGNOSIS — K589 Irritable bowel syndrome without diarrhea: Secondary | ICD-10-CM | POA: Diagnosis not present

## 2017-02-27 DIAGNOSIS — L930 Discoid lupus erythematosus: Secondary | ICD-10-CM | POA: Diagnosis not present

## 2017-02-27 DIAGNOSIS — N318 Other neuromuscular dysfunction of bladder: Secondary | ICD-10-CM | POA: Diagnosis not present

## 2017-02-27 DIAGNOSIS — K219 Gastro-esophageal reflux disease without esophagitis: Secondary | ICD-10-CM | POA: Diagnosis not present

## 2017-02-27 DIAGNOSIS — F411 Generalized anxiety disorder: Secondary | ICD-10-CM | POA: Diagnosis not present

## 2017-02-27 DIAGNOSIS — R600 Localized edema: Secondary | ICD-10-CM | POA: Diagnosis not present

## 2017-02-27 DIAGNOSIS — J208 Acute bronchitis due to other specified organisms: Secondary | ICD-10-CM | POA: Diagnosis not present

## 2017-02-27 DIAGNOSIS — J449 Chronic obstructive pulmonary disease, unspecified: Secondary | ICD-10-CM | POA: Diagnosis not present

## 2017-02-27 DIAGNOSIS — R945 Abnormal results of liver function studies: Secondary | ICD-10-CM | POA: Diagnosis not present

## 2017-02-27 DIAGNOSIS — F329 Major depressive disorder, single episode, unspecified: Secondary | ICD-10-CM | POA: Diagnosis not present

## 2017-02-27 DIAGNOSIS — F5101 Primary insomnia: Secondary | ICD-10-CM | POA: Diagnosis not present

## 2017-02-27 DIAGNOSIS — R110 Nausea: Secondary | ICD-10-CM | POA: Diagnosis not present

## 2017-03-01 ENCOUNTER — Ambulatory Visit: Payer: PPO | Admitting: Internal Medicine

## 2017-03-05 DIAGNOSIS — R600 Localized edema: Secondary | ICD-10-CM | POA: Diagnosis not present

## 2017-03-05 DIAGNOSIS — R945 Abnormal results of liver function studies: Secondary | ICD-10-CM | POA: Diagnosis not present

## 2017-03-05 DIAGNOSIS — K589 Irritable bowel syndrome without diarrhea: Secondary | ICD-10-CM | POA: Diagnosis not present

## 2017-03-05 DIAGNOSIS — F411 Generalized anxiety disorder: Secondary | ICD-10-CM | POA: Diagnosis not present

## 2017-03-05 DIAGNOSIS — K219 Gastro-esophageal reflux disease without esophagitis: Secondary | ICD-10-CM | POA: Diagnosis not present

## 2017-03-05 DIAGNOSIS — F5101 Primary insomnia: Secondary | ICD-10-CM | POA: Diagnosis not present

## 2017-03-05 DIAGNOSIS — F329 Major depressive disorder, single episode, unspecified: Secondary | ICD-10-CM | POA: Diagnosis not present

## 2017-03-05 DIAGNOSIS — L930 Discoid lupus erythematosus: Secondary | ICD-10-CM | POA: Diagnosis not present

## 2017-03-05 DIAGNOSIS — M159 Polyosteoarthritis, unspecified: Secondary | ICD-10-CM | POA: Diagnosis not present

## 2017-03-05 DIAGNOSIS — R1012 Left upper quadrant pain: Secondary | ICD-10-CM | POA: Diagnosis not present

## 2017-03-05 DIAGNOSIS — N318 Other neuromuscular dysfunction of bladder: Secondary | ICD-10-CM | POA: Diagnosis not present

## 2017-03-05 DIAGNOSIS — J449 Chronic obstructive pulmonary disease, unspecified: Secondary | ICD-10-CM | POA: Diagnosis not present

## 2017-03-06 DIAGNOSIS — Z79899 Other long term (current) drug therapy: Secondary | ICD-10-CM | POA: Diagnosis not present

## 2017-03-06 DIAGNOSIS — R1013 Epigastric pain: Secondary | ICD-10-CM | POA: Diagnosis not present

## 2017-03-06 DIAGNOSIS — M329 Systemic lupus erythematosus, unspecified: Secondary | ICD-10-CM | POA: Diagnosis not present

## 2017-03-12 DIAGNOSIS — R1010 Upper abdominal pain, unspecified: Secondary | ICD-10-CM | POA: Diagnosis not present

## 2017-03-12 DIAGNOSIS — R1012 Left upper quadrant pain: Secondary | ICD-10-CM | POA: Diagnosis not present

## 2017-03-12 DIAGNOSIS — J432 Centrilobular emphysema: Secondary | ICD-10-CM | POA: Diagnosis not present

## 2017-03-12 DIAGNOSIS — I700 Atherosclerosis of aorta: Secondary | ICD-10-CM | POA: Diagnosis not present

## 2017-03-13 DIAGNOSIS — R945 Abnormal results of liver function studies: Secondary | ICD-10-CM | POA: Diagnosis not present

## 2017-03-13 DIAGNOSIS — L930 Discoid lupus erythematosus: Secondary | ICD-10-CM | POA: Diagnosis not present

## 2017-03-13 DIAGNOSIS — I700 Atherosclerosis of aorta: Secondary | ICD-10-CM | POA: Diagnosis not present

## 2017-03-13 DIAGNOSIS — M4802 Spinal stenosis, cervical region: Secondary | ICD-10-CM | POA: Diagnosis not present

## 2017-03-13 DIAGNOSIS — K589 Irritable bowel syndrome without diarrhea: Secondary | ICD-10-CM | POA: Diagnosis not present

## 2017-03-13 DIAGNOSIS — F329 Major depressive disorder, single episode, unspecified: Secondary | ICD-10-CM | POA: Diagnosis not present

## 2017-03-13 DIAGNOSIS — M549 Dorsalgia, unspecified: Secondary | ICD-10-CM | POA: Diagnosis not present

## 2017-03-13 DIAGNOSIS — R600 Localized edema: Secondary | ICD-10-CM | POA: Diagnosis not present

## 2017-03-13 DIAGNOSIS — F5101 Primary insomnia: Secondary | ICD-10-CM | POA: Diagnosis not present

## 2017-03-13 DIAGNOSIS — F411 Generalized anxiety disorder: Secondary | ICD-10-CM | POA: Diagnosis not present

## 2017-03-13 DIAGNOSIS — N318 Other neuromuscular dysfunction of bladder: Secondary | ICD-10-CM | POA: Diagnosis not present

## 2017-03-13 DIAGNOSIS — K219 Gastro-esophageal reflux disease without esophagitis: Secondary | ICD-10-CM | POA: Diagnosis not present

## 2017-03-13 DIAGNOSIS — J449 Chronic obstructive pulmonary disease, unspecified: Secondary | ICD-10-CM | POA: Diagnosis not present

## 2017-03-13 DIAGNOSIS — M4804 Spinal stenosis, thoracic region: Secondary | ICD-10-CM | POA: Diagnosis not present

## 2017-03-27 DIAGNOSIS — K589 Irritable bowel syndrome without diarrhea: Secondary | ICD-10-CM | POA: Diagnosis not present

## 2017-03-27 DIAGNOSIS — F411 Generalized anxiety disorder: Secondary | ICD-10-CM | POA: Diagnosis not present

## 2017-03-27 DIAGNOSIS — R110 Nausea: Secondary | ICD-10-CM | POA: Diagnosis not present

## 2017-03-27 DIAGNOSIS — F5101 Primary insomnia: Secondary | ICD-10-CM | POA: Diagnosis not present

## 2017-03-27 DIAGNOSIS — R945 Abnormal results of liver function studies: Secondary | ICD-10-CM | POA: Diagnosis not present

## 2017-03-27 DIAGNOSIS — J449 Chronic obstructive pulmonary disease, unspecified: Secondary | ICD-10-CM | POA: Diagnosis not present

## 2017-03-27 DIAGNOSIS — L930 Discoid lupus erythematosus: Secondary | ICD-10-CM | POA: Diagnosis not present

## 2017-03-27 DIAGNOSIS — F329 Major depressive disorder, single episode, unspecified: Secondary | ICD-10-CM | POA: Diagnosis not present

## 2017-03-27 DIAGNOSIS — K219 Gastro-esophageal reflux disease without esophagitis: Secondary | ICD-10-CM | POA: Diagnosis not present

## 2017-03-27 DIAGNOSIS — R600 Localized edema: Secondary | ICD-10-CM | POA: Diagnosis not present

## 2017-03-27 DIAGNOSIS — I700 Atherosclerosis of aorta: Secondary | ICD-10-CM | POA: Diagnosis not present

## 2017-03-27 DIAGNOSIS — N318 Other neuromuscular dysfunction of bladder: Secondary | ICD-10-CM | POA: Diagnosis not present

## 2017-05-09 DIAGNOSIS — F329 Major depressive disorder, single episode, unspecified: Secondary | ICD-10-CM | POA: Diagnosis not present

## 2017-05-09 DIAGNOSIS — R110 Nausea: Secondary | ICD-10-CM | POA: Diagnosis not present

## 2017-05-09 DIAGNOSIS — G609 Hereditary and idiopathic neuropathy, unspecified: Secondary | ICD-10-CM | POA: Diagnosis not present

## 2017-05-09 DIAGNOSIS — K219 Gastro-esophageal reflux disease without esophagitis: Secondary | ICD-10-CM | POA: Diagnosis not present

## 2017-05-09 DIAGNOSIS — N318 Other neuromuscular dysfunction of bladder: Secondary | ICD-10-CM | POA: Diagnosis not present

## 2017-05-09 DIAGNOSIS — Z780 Asymptomatic menopausal state: Secondary | ICD-10-CM | POA: Diagnosis not present

## 2017-05-09 DIAGNOSIS — R945 Abnormal results of liver function studies: Secondary | ICD-10-CM | POA: Diagnosis not present

## 2017-05-09 DIAGNOSIS — F5101 Primary insomnia: Secondary | ICD-10-CM | POA: Diagnosis not present

## 2017-05-09 DIAGNOSIS — J208 Acute bronchitis due to other specified organisms: Secondary | ICD-10-CM | POA: Diagnosis not present

## 2017-05-09 DIAGNOSIS — K589 Irritable bowel syndrome without diarrhea: Secondary | ICD-10-CM | POA: Diagnosis not present

## 2017-05-09 DIAGNOSIS — J449 Chronic obstructive pulmonary disease, unspecified: Secondary | ICD-10-CM | POA: Diagnosis not present

## 2017-05-09 DIAGNOSIS — R600 Localized edema: Secondary | ICD-10-CM | POA: Diagnosis not present

## 2017-06-11 ENCOUNTER — Ambulatory Visit: Payer: PPO | Admitting: Internal Medicine

## 2017-06-14 DIAGNOSIS — R945 Abnormal results of liver function studies: Secondary | ICD-10-CM | POA: Diagnosis not present

## 2017-06-14 DIAGNOSIS — R110 Nausea: Secondary | ICD-10-CM | POA: Diagnosis not present

## 2017-06-14 DIAGNOSIS — G609 Hereditary and idiopathic neuropathy, unspecified: Secondary | ICD-10-CM | POA: Diagnosis not present

## 2017-06-14 DIAGNOSIS — F5101 Primary insomnia: Secondary | ICD-10-CM | POA: Diagnosis not present

## 2017-06-14 DIAGNOSIS — K589 Irritable bowel syndrome without diarrhea: Secondary | ICD-10-CM | POA: Diagnosis not present

## 2017-06-14 DIAGNOSIS — J449 Chronic obstructive pulmonary disease, unspecified: Secondary | ICD-10-CM | POA: Diagnosis not present

## 2017-06-14 DIAGNOSIS — Z780 Asymptomatic menopausal state: Secondary | ICD-10-CM | POA: Diagnosis not present

## 2017-06-14 DIAGNOSIS — R918 Other nonspecific abnormal finding of lung field: Secondary | ICD-10-CM | POA: Diagnosis not present

## 2017-06-14 DIAGNOSIS — N318 Other neuromuscular dysfunction of bladder: Secondary | ICD-10-CM | POA: Diagnosis not present

## 2017-06-14 DIAGNOSIS — K219 Gastro-esophageal reflux disease without esophagitis: Secondary | ICD-10-CM | POA: Diagnosis not present

## 2017-06-14 DIAGNOSIS — J208 Acute bronchitis due to other specified organisms: Secondary | ICD-10-CM | POA: Diagnosis not present

## 2017-06-14 DIAGNOSIS — R600 Localized edema: Secondary | ICD-10-CM | POA: Diagnosis not present

## 2017-07-17 DIAGNOSIS — R918 Other nonspecific abnormal finding of lung field: Secondary | ICD-10-CM | POA: Diagnosis not present

## 2017-07-17 DIAGNOSIS — R110 Nausea: Secondary | ICD-10-CM | POA: Diagnosis not present

## 2017-07-17 DIAGNOSIS — R600 Localized edema: Secondary | ICD-10-CM | POA: Diagnosis not present

## 2017-07-17 DIAGNOSIS — R634 Abnormal weight loss: Secondary | ICD-10-CM | POA: Diagnosis not present

## 2017-07-17 DIAGNOSIS — Z780 Asymptomatic menopausal state: Secondary | ICD-10-CM | POA: Diagnosis not present

## 2017-07-17 DIAGNOSIS — F5101 Primary insomnia: Secondary | ICD-10-CM | POA: Diagnosis not present

## 2017-07-17 DIAGNOSIS — K589 Irritable bowel syndrome without diarrhea: Secondary | ICD-10-CM | POA: Diagnosis not present

## 2017-07-17 DIAGNOSIS — G609 Hereditary and idiopathic neuropathy, unspecified: Secondary | ICD-10-CM | POA: Diagnosis not present

## 2017-07-17 DIAGNOSIS — K219 Gastro-esophageal reflux disease without esophagitis: Secondary | ICD-10-CM | POA: Diagnosis not present

## 2017-07-17 DIAGNOSIS — J449 Chronic obstructive pulmonary disease, unspecified: Secondary | ICD-10-CM | POA: Diagnosis not present

## 2017-07-17 DIAGNOSIS — N318 Other neuromuscular dysfunction of bladder: Secondary | ICD-10-CM | POA: Diagnosis not present

## 2017-07-17 DIAGNOSIS — J309 Allergic rhinitis, unspecified: Secondary | ICD-10-CM | POA: Diagnosis not present

## 2017-07-19 ENCOUNTER — Ambulatory Visit: Payer: PPO | Admitting: Internal Medicine

## 2017-08-07 DIAGNOSIS — R945 Abnormal results of liver function studies: Secondary | ICD-10-CM | POA: Diagnosis not present

## 2017-08-07 DIAGNOSIS — J208 Acute bronchitis due to other specified organisms: Secondary | ICD-10-CM | POA: Diagnosis not present

## 2017-08-07 DIAGNOSIS — N318 Other neuromuscular dysfunction of bladder: Secondary | ICD-10-CM | POA: Diagnosis not present

## 2017-08-07 DIAGNOSIS — R600 Localized edema: Secondary | ICD-10-CM | POA: Diagnosis not present

## 2017-08-07 DIAGNOSIS — J449 Chronic obstructive pulmonary disease, unspecified: Secondary | ICD-10-CM | POA: Diagnosis not present

## 2017-08-07 DIAGNOSIS — K219 Gastro-esophageal reflux disease without esophagitis: Secondary | ICD-10-CM | POA: Diagnosis not present

## 2017-08-07 DIAGNOSIS — G609 Hereditary and idiopathic neuropathy, unspecified: Secondary | ICD-10-CM | POA: Diagnosis not present

## 2017-08-07 DIAGNOSIS — R110 Nausea: Secondary | ICD-10-CM | POA: Diagnosis not present

## 2017-08-07 DIAGNOSIS — R918 Other nonspecific abnormal finding of lung field: Secondary | ICD-10-CM | POA: Diagnosis not present

## 2017-08-07 DIAGNOSIS — Z780 Asymptomatic menopausal state: Secondary | ICD-10-CM | POA: Diagnosis not present

## 2017-08-07 DIAGNOSIS — F5101 Primary insomnia: Secondary | ICD-10-CM | POA: Diagnosis not present

## 2017-08-07 DIAGNOSIS — K589 Irritable bowel syndrome without diarrhea: Secondary | ICD-10-CM | POA: Diagnosis not present

## 2017-09-11 DIAGNOSIS — N318 Other neuromuscular dysfunction of bladder: Secondary | ICD-10-CM | POA: Diagnosis not present

## 2017-09-11 DIAGNOSIS — G609 Hereditary and idiopathic neuropathy, unspecified: Secondary | ICD-10-CM | POA: Diagnosis not present

## 2017-09-11 DIAGNOSIS — J449 Chronic obstructive pulmonary disease, unspecified: Secondary | ICD-10-CM | POA: Diagnosis not present

## 2017-09-11 DIAGNOSIS — K589 Irritable bowel syndrome without diarrhea: Secondary | ICD-10-CM | POA: Diagnosis not present

## 2017-09-11 DIAGNOSIS — R945 Abnormal results of liver function studies: Secondary | ICD-10-CM | POA: Diagnosis not present

## 2017-09-11 DIAGNOSIS — Z780 Asymptomatic menopausal state: Secondary | ICD-10-CM | POA: Diagnosis not present

## 2017-09-11 DIAGNOSIS — F5101 Primary insomnia: Secondary | ICD-10-CM | POA: Diagnosis not present

## 2017-09-11 DIAGNOSIS — R600 Localized edema: Secondary | ICD-10-CM | POA: Diagnosis not present

## 2017-09-11 DIAGNOSIS — R110 Nausea: Secondary | ICD-10-CM | POA: Diagnosis not present

## 2017-09-11 DIAGNOSIS — K219 Gastro-esophageal reflux disease without esophagitis: Secondary | ICD-10-CM | POA: Diagnosis not present

## 2017-09-11 DIAGNOSIS — K529 Noninfective gastroenteritis and colitis, unspecified: Secondary | ICD-10-CM | POA: Diagnosis not present

## 2017-09-11 DIAGNOSIS — R918 Other nonspecific abnormal finding of lung field: Secondary | ICD-10-CM | POA: Diagnosis not present

## 2017-10-16 DIAGNOSIS — K589 Irritable bowel syndrome without diarrhea: Secondary | ICD-10-CM | POA: Diagnosis not present

## 2017-10-16 DIAGNOSIS — J208 Acute bronchitis due to other specified organisms: Secondary | ICD-10-CM | POA: Diagnosis not present

## 2017-10-16 DIAGNOSIS — F5101 Primary insomnia: Secondary | ICD-10-CM | POA: Diagnosis not present

## 2017-10-16 DIAGNOSIS — I700 Atherosclerosis of aorta: Secondary | ICD-10-CM | POA: Diagnosis not present

## 2017-10-16 DIAGNOSIS — R110 Nausea: Secondary | ICD-10-CM | POA: Diagnosis not present

## 2017-10-16 DIAGNOSIS — Z780 Asymptomatic menopausal state: Secondary | ICD-10-CM | POA: Diagnosis not present

## 2017-10-16 DIAGNOSIS — R945 Abnormal results of liver function studies: Secondary | ICD-10-CM | POA: Diagnosis not present

## 2017-10-16 DIAGNOSIS — R600 Localized edema: Secondary | ICD-10-CM | POA: Diagnosis not present

## 2017-10-16 DIAGNOSIS — G609 Hereditary and idiopathic neuropathy, unspecified: Secondary | ICD-10-CM | POA: Diagnosis not present

## 2017-10-16 DIAGNOSIS — K219 Gastro-esophageal reflux disease without esophagitis: Secondary | ICD-10-CM | POA: Diagnosis not present

## 2017-10-16 DIAGNOSIS — J449 Chronic obstructive pulmonary disease, unspecified: Secondary | ICD-10-CM | POA: Diagnosis not present

## 2017-10-16 DIAGNOSIS — R918 Other nonspecific abnormal finding of lung field: Secondary | ICD-10-CM | POA: Diagnosis not present

## 2017-11-13 DIAGNOSIS — F5101 Primary insomnia: Secondary | ICD-10-CM | POA: Diagnosis not present

## 2017-11-13 DIAGNOSIS — R110 Nausea: Secondary | ICD-10-CM | POA: Diagnosis not present

## 2017-11-13 DIAGNOSIS — J208 Acute bronchitis due to other specified organisms: Secondary | ICD-10-CM | POA: Diagnosis not present

## 2017-11-13 DIAGNOSIS — R945 Abnormal results of liver function studies: Secondary | ICD-10-CM | POA: Diagnosis not present

## 2017-11-13 DIAGNOSIS — R600 Localized edema: Secondary | ICD-10-CM | POA: Diagnosis not present

## 2017-11-13 DIAGNOSIS — M159 Polyosteoarthritis, unspecified: Secondary | ICD-10-CM | POA: Diagnosis not present

## 2017-11-13 DIAGNOSIS — K219 Gastro-esophageal reflux disease without esophagitis: Secondary | ICD-10-CM | POA: Diagnosis not present

## 2017-11-13 DIAGNOSIS — G609 Hereditary and idiopathic neuropathy, unspecified: Secondary | ICD-10-CM | POA: Diagnosis not present

## 2017-11-13 DIAGNOSIS — R918 Other nonspecific abnormal finding of lung field: Secondary | ICD-10-CM | POA: Diagnosis not present

## 2017-11-13 DIAGNOSIS — Z780 Asymptomatic menopausal state: Secondary | ICD-10-CM | POA: Diagnosis not present

## 2017-11-13 DIAGNOSIS — R079 Chest pain, unspecified: Secondary | ICD-10-CM | POA: Diagnosis not present

## 2017-11-13 DIAGNOSIS — J449 Chronic obstructive pulmonary disease, unspecified: Secondary | ICD-10-CM | POA: Diagnosis not present

## 2017-11-13 DIAGNOSIS — K589 Irritable bowel syndrome without diarrhea: Secondary | ICD-10-CM | POA: Diagnosis not present

## 2017-12-18 DIAGNOSIS — R945 Abnormal results of liver function studies: Secondary | ICD-10-CM | POA: Diagnosis not present

## 2017-12-18 DIAGNOSIS — F411 Generalized anxiety disorder: Secondary | ICD-10-CM | POA: Diagnosis not present

## 2017-12-18 DIAGNOSIS — M25552 Pain in left hip: Secondary | ICD-10-CM | POA: Diagnosis not present

## 2017-12-18 DIAGNOSIS — K219 Gastro-esophageal reflux disease without esophagitis: Secondary | ICD-10-CM | POA: Diagnosis not present

## 2017-12-18 DIAGNOSIS — N318 Other neuromuscular dysfunction of bladder: Secondary | ICD-10-CM | POA: Diagnosis not present

## 2017-12-18 DIAGNOSIS — R600 Localized edema: Secondary | ICD-10-CM | POA: Diagnosis not present

## 2017-12-18 DIAGNOSIS — L930 Discoid lupus erythematosus: Secondary | ICD-10-CM | POA: Diagnosis not present

## 2017-12-18 DIAGNOSIS — K589 Irritable bowel syndrome without diarrhea: Secondary | ICD-10-CM | POA: Diagnosis not present

## 2017-12-18 DIAGNOSIS — F5101 Primary insomnia: Secondary | ICD-10-CM | POA: Diagnosis not present

## 2017-12-18 DIAGNOSIS — I700 Atherosclerosis of aorta: Secondary | ICD-10-CM | POA: Diagnosis not present

## 2017-12-18 DIAGNOSIS — J449 Chronic obstructive pulmonary disease, unspecified: Secondary | ICD-10-CM | POA: Diagnosis not present

## 2017-12-18 DIAGNOSIS — F329 Major depressive disorder, single episode, unspecified: Secondary | ICD-10-CM | POA: Diagnosis not present

## 2018-01-17 DIAGNOSIS — S61452A Open bite of left hand, initial encounter: Secondary | ICD-10-CM | POA: Diagnosis not present

## 2018-01-22 DIAGNOSIS — K219 Gastro-esophageal reflux disease without esophagitis: Secondary | ICD-10-CM | POA: Diagnosis not present

## 2018-01-22 DIAGNOSIS — F411 Generalized anxiety disorder: Secondary | ICD-10-CM | POA: Diagnosis not present

## 2018-01-22 DIAGNOSIS — R110 Nausea: Secondary | ICD-10-CM | POA: Diagnosis not present

## 2018-01-22 DIAGNOSIS — L930 Discoid lupus erythematosus: Secondary | ICD-10-CM | POA: Diagnosis not present

## 2018-01-22 DIAGNOSIS — N318 Other neuromuscular dysfunction of bladder: Secondary | ICD-10-CM | POA: Diagnosis not present

## 2018-01-22 DIAGNOSIS — J449 Chronic obstructive pulmonary disease, unspecified: Secondary | ICD-10-CM | POA: Diagnosis not present

## 2018-01-22 DIAGNOSIS — R945 Abnormal results of liver function studies: Secondary | ICD-10-CM | POA: Diagnosis not present

## 2018-01-22 DIAGNOSIS — I700 Atherosclerosis of aorta: Secondary | ICD-10-CM | POA: Diagnosis not present

## 2018-01-22 DIAGNOSIS — F5101 Primary insomnia: Secondary | ICD-10-CM | POA: Diagnosis not present

## 2018-01-22 DIAGNOSIS — R600 Localized edema: Secondary | ICD-10-CM | POA: Diagnosis not present

## 2018-01-22 DIAGNOSIS — F329 Major depressive disorder, single episode, unspecified: Secondary | ICD-10-CM | POA: Diagnosis not present

## 2018-01-22 DIAGNOSIS — K589 Irritable bowel syndrome without diarrhea: Secondary | ICD-10-CM | POA: Diagnosis not present

## 2018-02-20 DIAGNOSIS — K219 Gastro-esophageal reflux disease without esophagitis: Secondary | ICD-10-CM | POA: Diagnosis not present

## 2018-02-20 DIAGNOSIS — F411 Generalized anxiety disorder: Secondary | ICD-10-CM | POA: Diagnosis not present

## 2018-02-20 DIAGNOSIS — Z1331 Encounter for screening for depression: Secondary | ICD-10-CM | POA: Diagnosis not present

## 2018-02-20 DIAGNOSIS — R600 Localized edema: Secondary | ICD-10-CM | POA: Diagnosis not present

## 2018-02-20 DIAGNOSIS — F329 Major depressive disorder, single episode, unspecified: Secondary | ICD-10-CM | POA: Diagnosis not present

## 2018-02-20 DIAGNOSIS — R945 Abnormal results of liver function studies: Secondary | ICD-10-CM | POA: Diagnosis not present

## 2018-02-20 DIAGNOSIS — H6693 Otitis media, unspecified, bilateral: Secondary | ICD-10-CM | POA: Diagnosis not present

## 2018-02-20 DIAGNOSIS — F5101 Primary insomnia: Secondary | ICD-10-CM | POA: Diagnosis not present

## 2018-02-20 DIAGNOSIS — I700 Atherosclerosis of aorta: Secondary | ICD-10-CM | POA: Diagnosis not present

## 2018-02-20 DIAGNOSIS — N318 Other neuromuscular dysfunction of bladder: Secondary | ICD-10-CM | POA: Diagnosis not present

## 2018-02-20 DIAGNOSIS — L930 Discoid lupus erythematosus: Secondary | ICD-10-CM | POA: Diagnosis not present

## 2018-02-20 DIAGNOSIS — K589 Irritable bowel syndrome without diarrhea: Secondary | ICD-10-CM | POA: Diagnosis not present

## 2018-03-05 DIAGNOSIS — F411 Generalized anxiety disorder: Secondary | ICD-10-CM | POA: Diagnosis not present

## 2018-03-05 DIAGNOSIS — K589 Irritable bowel syndrome without diarrhea: Secondary | ICD-10-CM | POA: Diagnosis not present

## 2018-03-05 DIAGNOSIS — F329 Major depressive disorder, single episode, unspecified: Secondary | ICD-10-CM | POA: Diagnosis not present

## 2018-03-05 DIAGNOSIS — F5101 Primary insomnia: Secondary | ICD-10-CM | POA: Diagnosis not present

## 2018-03-05 DIAGNOSIS — N318 Other neuromuscular dysfunction of bladder: Secondary | ICD-10-CM | POA: Diagnosis not present

## 2018-03-05 DIAGNOSIS — H9313 Tinnitus, bilateral: Secondary | ICD-10-CM | POA: Diagnosis not present

## 2018-03-05 DIAGNOSIS — I700 Atherosclerosis of aorta: Secondary | ICD-10-CM | POA: Diagnosis not present

## 2018-03-05 DIAGNOSIS — J449 Chronic obstructive pulmonary disease, unspecified: Secondary | ICD-10-CM | POA: Diagnosis not present

## 2018-03-05 DIAGNOSIS — K219 Gastro-esophageal reflux disease without esophagitis: Secondary | ICD-10-CM | POA: Diagnosis not present

## 2018-03-05 DIAGNOSIS — R945 Abnormal results of liver function studies: Secondary | ICD-10-CM | POA: Diagnosis not present

## 2018-03-05 DIAGNOSIS — L930 Discoid lupus erythematosus: Secondary | ICD-10-CM | POA: Diagnosis not present

## 2018-03-05 DIAGNOSIS — R600 Localized edema: Secondary | ICD-10-CM | POA: Diagnosis not present

## 2018-03-19 DIAGNOSIS — K219 Gastro-esophageal reflux disease without esophagitis: Secondary | ICD-10-CM | POA: Diagnosis not present

## 2018-03-19 DIAGNOSIS — R945 Abnormal results of liver function studies: Secondary | ICD-10-CM | POA: Diagnosis not present

## 2018-03-19 DIAGNOSIS — M159 Polyosteoarthritis, unspecified: Secondary | ICD-10-CM | POA: Diagnosis not present

## 2018-03-19 DIAGNOSIS — N318 Other neuromuscular dysfunction of bladder: Secondary | ICD-10-CM | POA: Diagnosis not present

## 2018-03-19 DIAGNOSIS — R110 Nausea: Secondary | ICD-10-CM | POA: Diagnosis not present

## 2018-03-19 DIAGNOSIS — L930 Discoid lupus erythematosus: Secondary | ICD-10-CM | POA: Diagnosis not present

## 2018-03-19 DIAGNOSIS — F411 Generalized anxiety disorder: Secondary | ICD-10-CM | POA: Diagnosis not present

## 2018-03-19 DIAGNOSIS — I700 Atherosclerosis of aorta: Secondary | ICD-10-CM | POA: Diagnosis not present

## 2018-03-19 DIAGNOSIS — R600 Localized edema: Secondary | ICD-10-CM | POA: Diagnosis not present

## 2018-03-19 DIAGNOSIS — K589 Irritable bowel syndrome without diarrhea: Secondary | ICD-10-CM | POA: Diagnosis not present

## 2018-03-19 DIAGNOSIS — F5101 Primary insomnia: Secondary | ICD-10-CM | POA: Diagnosis not present

## 2018-03-19 DIAGNOSIS — F329 Major depressive disorder, single episode, unspecified: Secondary | ICD-10-CM | POA: Diagnosis not present

## 2018-04-23 DIAGNOSIS — I700 Atherosclerosis of aorta: Secondary | ICD-10-CM | POA: Diagnosis not present

## 2018-04-23 DIAGNOSIS — N318 Other neuromuscular dysfunction of bladder: Secondary | ICD-10-CM | POA: Diagnosis not present

## 2018-04-23 DIAGNOSIS — K219 Gastro-esophageal reflux disease without esophagitis: Secondary | ICD-10-CM | POA: Diagnosis not present

## 2018-04-23 DIAGNOSIS — R600 Localized edema: Secondary | ICD-10-CM | POA: Diagnosis not present

## 2018-04-23 DIAGNOSIS — L930 Discoid lupus erythematosus: Secondary | ICD-10-CM | POA: Diagnosis not present

## 2018-04-23 DIAGNOSIS — R945 Abnormal results of liver function studies: Secondary | ICD-10-CM | POA: Diagnosis not present

## 2018-04-23 DIAGNOSIS — J208 Acute bronchitis due to other specified organisms: Secondary | ICD-10-CM | POA: Diagnosis not present

## 2018-04-23 DIAGNOSIS — N959 Unspecified menopausal and perimenopausal disorder: Secondary | ICD-10-CM | POA: Diagnosis not present

## 2018-04-23 DIAGNOSIS — J449 Chronic obstructive pulmonary disease, unspecified: Secondary | ICD-10-CM | POA: Diagnosis not present

## 2018-04-23 DIAGNOSIS — F5101 Primary insomnia: Secondary | ICD-10-CM | POA: Diagnosis not present

## 2018-04-23 DIAGNOSIS — Z0000 Encounter for general adult medical examination without abnormal findings: Secondary | ICD-10-CM | POA: Diagnosis not present

## 2018-05-02 DIAGNOSIS — J208 Acute bronchitis due to other specified organisms: Secondary | ICD-10-CM | POA: Diagnosis not present

## 2018-05-02 DIAGNOSIS — J449 Chronic obstructive pulmonary disease, unspecified: Secondary | ICD-10-CM | POA: Diagnosis not present

## 2018-05-02 DIAGNOSIS — F5101 Primary insomnia: Secondary | ICD-10-CM | POA: Diagnosis not present

## 2018-05-02 DIAGNOSIS — F411 Generalized anxiety disorder: Secondary | ICD-10-CM | POA: Diagnosis not present

## 2018-05-02 DIAGNOSIS — L930 Discoid lupus erythematosus: Secondary | ICD-10-CM | POA: Diagnosis not present

## 2018-05-02 DIAGNOSIS — R110 Nausea: Secondary | ICD-10-CM | POA: Diagnosis not present

## 2018-05-02 DIAGNOSIS — I700 Atherosclerosis of aorta: Secondary | ICD-10-CM | POA: Diagnosis not present

## 2018-05-02 DIAGNOSIS — R600 Localized edema: Secondary | ICD-10-CM | POA: Diagnosis not present

## 2018-05-02 DIAGNOSIS — K219 Gastro-esophageal reflux disease without esophagitis: Secondary | ICD-10-CM | POA: Diagnosis not present

## 2018-05-02 DIAGNOSIS — N318 Other neuromuscular dysfunction of bladder: Secondary | ICD-10-CM | POA: Diagnosis not present

## 2018-05-02 DIAGNOSIS — K589 Irritable bowel syndrome without diarrhea: Secondary | ICD-10-CM | POA: Diagnosis not present

## 2018-05-02 DIAGNOSIS — R945 Abnormal results of liver function studies: Secondary | ICD-10-CM | POA: Diagnosis not present

## 2018-05-16 DIAGNOSIS — J342 Deviated nasal septum: Secondary | ICD-10-CM | POA: Diagnosis not present

## 2018-05-16 DIAGNOSIS — H938X1 Other specified disorders of right ear: Secondary | ICD-10-CM | POA: Diagnosis not present

## 2018-05-16 DIAGNOSIS — R0981 Nasal congestion: Secondary | ICD-10-CM | POA: Diagnosis not present

## 2018-05-16 DIAGNOSIS — H903 Sensorineural hearing loss, bilateral: Secondary | ICD-10-CM | POA: Diagnosis not present

## 2018-05-16 DIAGNOSIS — H9311 Tinnitus, right ear: Secondary | ICD-10-CM | POA: Diagnosis not present

## 2018-05-16 DIAGNOSIS — J343 Hypertrophy of nasal turbinates: Secondary | ICD-10-CM | POA: Diagnosis not present

## 2018-05-28 DIAGNOSIS — G609 Hereditary and idiopathic neuropathy, unspecified: Secondary | ICD-10-CM | POA: Diagnosis not present

## 2018-05-28 DIAGNOSIS — K589 Irritable bowel syndrome without diarrhea: Secondary | ICD-10-CM | POA: Diagnosis not present

## 2018-05-28 DIAGNOSIS — R918 Other nonspecific abnormal finding of lung field: Secondary | ICD-10-CM | POA: Diagnosis not present

## 2018-05-28 DIAGNOSIS — L930 Discoid lupus erythematosus: Secondary | ICD-10-CM | POA: Diagnosis not present

## 2018-05-28 DIAGNOSIS — N318 Other neuromuscular dysfunction of bladder: Secondary | ICD-10-CM | POA: Diagnosis not present

## 2018-05-28 DIAGNOSIS — R110 Nausea: Secondary | ICD-10-CM | POA: Diagnosis not present

## 2018-05-28 DIAGNOSIS — R600 Localized edema: Secondary | ICD-10-CM | POA: Diagnosis not present

## 2018-05-28 DIAGNOSIS — K219 Gastro-esophageal reflux disease without esophagitis: Secondary | ICD-10-CM | POA: Diagnosis not present

## 2018-05-28 DIAGNOSIS — I700 Atherosclerosis of aorta: Secondary | ICD-10-CM | POA: Diagnosis not present

## 2018-05-28 DIAGNOSIS — R945 Abnormal results of liver function studies: Secondary | ICD-10-CM | POA: Diagnosis not present

## 2018-05-28 DIAGNOSIS — F5101 Primary insomnia: Secondary | ICD-10-CM | POA: Diagnosis not present

## 2018-05-28 DIAGNOSIS — F411 Generalized anxiety disorder: Secondary | ICD-10-CM | POA: Diagnosis not present

## 2018-06-19 DIAGNOSIS — N736 Female pelvic peritoneal adhesions (postinfective): Secondary | ICD-10-CM | POA: Diagnosis not present

## 2018-06-19 DIAGNOSIS — K5900 Constipation, unspecified: Secondary | ICD-10-CM | POA: Diagnosis not present

## 2018-06-19 DIAGNOSIS — R102 Pelvic and perineal pain: Secondary | ICD-10-CM | POA: Diagnosis not present

## 2018-06-19 DIAGNOSIS — N760 Acute vaginitis: Secondary | ICD-10-CM | POA: Diagnosis not present

## 2018-07-01 DIAGNOSIS — K5900 Constipation, unspecified: Secondary | ICD-10-CM | POA: Diagnosis not present

## 2018-07-01 DIAGNOSIS — N736 Female pelvic peritoneal adhesions (postinfective): Secondary | ICD-10-CM | POA: Diagnosis not present

## 2018-07-01 DIAGNOSIS — R102 Pelvic and perineal pain: Secondary | ICD-10-CM | POA: Diagnosis not present

## 2018-07-02 DIAGNOSIS — N318 Other neuromuscular dysfunction of bladder: Secondary | ICD-10-CM | POA: Diagnosis not present

## 2018-07-02 DIAGNOSIS — F411 Generalized anxiety disorder: Secondary | ICD-10-CM | POA: Diagnosis not present

## 2018-07-02 DIAGNOSIS — K219 Gastro-esophageal reflux disease without esophagitis: Secondary | ICD-10-CM | POA: Diagnosis not present

## 2018-07-02 DIAGNOSIS — R110 Nausea: Secondary | ICD-10-CM | POA: Diagnosis not present

## 2018-07-02 DIAGNOSIS — I700 Atherosclerosis of aorta: Secondary | ICD-10-CM | POA: Diagnosis not present

## 2018-07-02 DIAGNOSIS — R918 Other nonspecific abnormal finding of lung field: Secondary | ICD-10-CM | POA: Diagnosis not present

## 2018-07-02 DIAGNOSIS — K589 Irritable bowel syndrome without diarrhea: Secondary | ICD-10-CM | POA: Diagnosis not present

## 2018-07-02 DIAGNOSIS — G609 Hereditary and idiopathic neuropathy, unspecified: Secondary | ICD-10-CM | POA: Diagnosis not present

## 2018-07-02 DIAGNOSIS — R600 Localized edema: Secondary | ICD-10-CM | POA: Diagnosis not present

## 2018-07-02 DIAGNOSIS — R945 Abnormal results of liver function studies: Secondary | ICD-10-CM | POA: Diagnosis not present

## 2018-07-02 DIAGNOSIS — F5101 Primary insomnia: Secondary | ICD-10-CM | POA: Diagnosis not present

## 2018-07-02 DIAGNOSIS — L930 Discoid lupus erythematosus: Secondary | ICD-10-CM | POA: Diagnosis not present

## 2018-07-11 DIAGNOSIS — E2839 Other primary ovarian failure: Secondary | ICD-10-CM | POA: Diagnosis not present

## 2018-07-11 DIAGNOSIS — R1030 Lower abdominal pain, unspecified: Secondary | ICD-10-CM | POA: Diagnosis not present

## 2018-07-11 DIAGNOSIS — Z1231 Encounter for screening mammogram for malignant neoplasm of breast: Secondary | ICD-10-CM | POA: Diagnosis not present

## 2018-07-11 DIAGNOSIS — J439 Emphysema, unspecified: Secondary | ICD-10-CM | POA: Diagnosis not present

## 2018-07-11 DIAGNOSIS — I700 Atherosclerosis of aorta: Secondary | ICD-10-CM | POA: Diagnosis not present

## 2018-07-11 DIAGNOSIS — N736 Female pelvic peritoneal adhesions (postinfective): Secondary | ICD-10-CM | POA: Diagnosis not present

## 2018-07-30 DIAGNOSIS — N318 Other neuromuscular dysfunction of bladder: Secondary | ICD-10-CM | POA: Diagnosis not present

## 2018-07-30 DIAGNOSIS — R600 Localized edema: Secondary | ICD-10-CM | POA: Diagnosis not present

## 2018-07-30 DIAGNOSIS — E46 Unspecified protein-calorie malnutrition: Secondary | ICD-10-CM | POA: Diagnosis not present

## 2018-07-30 DIAGNOSIS — L930 Discoid lupus erythematosus: Secondary | ICD-10-CM | POA: Diagnosis not present

## 2018-07-30 DIAGNOSIS — K589 Irritable bowel syndrome without diarrhea: Secondary | ICD-10-CM | POA: Diagnosis not present

## 2018-07-30 DIAGNOSIS — K5909 Other constipation: Secondary | ICD-10-CM | POA: Diagnosis not present

## 2018-07-30 DIAGNOSIS — R945 Abnormal results of liver function studies: Secondary | ICD-10-CM | POA: Diagnosis not present

## 2018-07-30 DIAGNOSIS — F411 Generalized anxiety disorder: Secondary | ICD-10-CM | POA: Diagnosis not present

## 2018-07-30 DIAGNOSIS — R110 Nausea: Secondary | ICD-10-CM | POA: Diagnosis not present

## 2018-07-30 DIAGNOSIS — F5101 Primary insomnia: Secondary | ICD-10-CM | POA: Diagnosis not present

## 2018-07-30 DIAGNOSIS — K219 Gastro-esophageal reflux disease without esophagitis: Secondary | ICD-10-CM | POA: Diagnosis not present

## 2018-07-30 DIAGNOSIS — I700 Atherosclerosis of aorta: Secondary | ICD-10-CM | POA: Diagnosis not present

## 2018-09-12 DIAGNOSIS — F411 Generalized anxiety disorder: Secondary | ICD-10-CM | POA: Diagnosis not present

## 2018-09-12 DIAGNOSIS — E46 Unspecified protein-calorie malnutrition: Secondary | ICD-10-CM | POA: Diagnosis not present

## 2018-09-12 DIAGNOSIS — R600 Localized edema: Secondary | ICD-10-CM | POA: Diagnosis not present

## 2018-09-12 DIAGNOSIS — L930 Discoid lupus erythematosus: Secondary | ICD-10-CM | POA: Diagnosis not present

## 2018-09-12 DIAGNOSIS — N318 Other neuromuscular dysfunction of bladder: Secondary | ICD-10-CM | POA: Diagnosis not present

## 2018-09-12 DIAGNOSIS — R110 Nausea: Secondary | ICD-10-CM | POA: Diagnosis not present

## 2018-09-12 DIAGNOSIS — R945 Abnormal results of liver function studies: Secondary | ICD-10-CM | POA: Diagnosis not present

## 2018-09-12 DIAGNOSIS — K589 Irritable bowel syndrome without diarrhea: Secondary | ICD-10-CM | POA: Diagnosis not present

## 2018-09-12 DIAGNOSIS — I700 Atherosclerosis of aorta: Secondary | ICD-10-CM | POA: Diagnosis not present

## 2018-09-12 DIAGNOSIS — K219 Gastro-esophageal reflux disease without esophagitis: Secondary | ICD-10-CM | POA: Diagnosis not present

## 2018-09-12 DIAGNOSIS — F5101 Primary insomnia: Secondary | ICD-10-CM | POA: Diagnosis not present

## 2018-09-12 DIAGNOSIS — G609 Hereditary and idiopathic neuropathy, unspecified: Secondary | ICD-10-CM | POA: Diagnosis not present

## 2018-10-10 DIAGNOSIS — E46 Unspecified protein-calorie malnutrition: Secondary | ICD-10-CM | POA: Diagnosis not present

## 2018-10-10 DIAGNOSIS — R918 Other nonspecific abnormal finding of lung field: Secondary | ICD-10-CM | POA: Diagnosis not present

## 2018-10-10 DIAGNOSIS — R6889 Other general symptoms and signs: Secondary | ICD-10-CM | POA: Diagnosis not present

## 2018-10-10 DIAGNOSIS — R600 Localized edema: Secondary | ICD-10-CM | POA: Diagnosis not present

## 2018-10-10 DIAGNOSIS — G609 Hereditary and idiopathic neuropathy, unspecified: Secondary | ICD-10-CM | POA: Diagnosis not present

## 2018-10-10 DIAGNOSIS — K589 Irritable bowel syndrome without diarrhea: Secondary | ICD-10-CM | POA: Diagnosis not present

## 2018-10-10 DIAGNOSIS — R110 Nausea: Secondary | ICD-10-CM | POA: Diagnosis not present

## 2018-10-10 DIAGNOSIS — N318 Other neuromuscular dysfunction of bladder: Secondary | ICD-10-CM | POA: Diagnosis not present

## 2018-10-10 DIAGNOSIS — R945 Abnormal results of liver function studies: Secondary | ICD-10-CM | POA: Diagnosis not present

## 2018-10-10 DIAGNOSIS — L930 Discoid lupus erythematosus: Secondary | ICD-10-CM | POA: Diagnosis not present

## 2018-10-10 DIAGNOSIS — F5101 Primary insomnia: Secondary | ICD-10-CM | POA: Diagnosis not present

## 2018-10-10 DIAGNOSIS — F411 Generalized anxiety disorder: Secondary | ICD-10-CM | POA: Diagnosis not present

## 2018-11-13 DIAGNOSIS — G609 Hereditary and idiopathic neuropathy, unspecified: Secondary | ICD-10-CM | POA: Diagnosis not present

## 2018-11-13 DIAGNOSIS — F5101 Primary insomnia: Secondary | ICD-10-CM | POA: Diagnosis not present

## 2018-11-13 DIAGNOSIS — L930 Discoid lupus erythematosus: Secondary | ICD-10-CM | POA: Diagnosis not present

## 2018-11-13 DIAGNOSIS — R600 Localized edema: Secondary | ICD-10-CM | POA: Diagnosis not present

## 2018-11-13 DIAGNOSIS — K589 Irritable bowel syndrome without diarrhea: Secondary | ICD-10-CM | POA: Diagnosis not present

## 2018-11-13 DIAGNOSIS — R110 Nausea: Secondary | ICD-10-CM | POA: Diagnosis not present

## 2018-11-13 DIAGNOSIS — N318 Other neuromuscular dysfunction of bladder: Secondary | ICD-10-CM | POA: Diagnosis not present

## 2018-11-13 DIAGNOSIS — R945 Abnormal results of liver function studies: Secondary | ICD-10-CM | POA: Diagnosis not present

## 2018-11-13 DIAGNOSIS — K219 Gastro-esophageal reflux disease without esophagitis: Secondary | ICD-10-CM | POA: Diagnosis not present

## 2018-11-13 DIAGNOSIS — F411 Generalized anxiety disorder: Secondary | ICD-10-CM | POA: Diagnosis not present

## 2018-11-13 DIAGNOSIS — R918 Other nonspecific abnormal finding of lung field: Secondary | ICD-10-CM | POA: Diagnosis not present

## 2018-11-13 DIAGNOSIS — I700 Atherosclerosis of aorta: Secondary | ICD-10-CM | POA: Diagnosis not present

## 2018-12-12 DIAGNOSIS — F5101 Primary insomnia: Secondary | ICD-10-CM | POA: Diagnosis not present

## 2018-12-12 DIAGNOSIS — R6889 Other general symptoms and signs: Secondary | ICD-10-CM | POA: Diagnosis not present

## 2018-12-12 DIAGNOSIS — F411 Generalized anxiety disorder: Secondary | ICD-10-CM | POA: Diagnosis not present

## 2018-12-12 DIAGNOSIS — K219 Gastro-esophageal reflux disease without esophagitis: Secondary | ICD-10-CM | POA: Diagnosis not present

## 2018-12-12 DIAGNOSIS — R945 Abnormal results of liver function studies: Secondary | ICD-10-CM | POA: Diagnosis not present

## 2018-12-12 DIAGNOSIS — L930 Discoid lupus erythematosus: Secondary | ICD-10-CM | POA: Diagnosis not present

## 2018-12-12 DIAGNOSIS — N318 Other neuromuscular dysfunction of bladder: Secondary | ICD-10-CM | POA: Diagnosis not present

## 2018-12-12 DIAGNOSIS — I700 Atherosclerosis of aorta: Secondary | ICD-10-CM | POA: Diagnosis not present

## 2018-12-12 DIAGNOSIS — R600 Localized edema: Secondary | ICD-10-CM | POA: Diagnosis not present

## 2018-12-12 DIAGNOSIS — R110 Nausea: Secondary | ICD-10-CM | POA: Diagnosis not present

## 2018-12-12 DIAGNOSIS — G609 Hereditary and idiopathic neuropathy, unspecified: Secondary | ICD-10-CM | POA: Diagnosis not present

## 2018-12-12 DIAGNOSIS — K589 Irritable bowel syndrome without diarrhea: Secondary | ICD-10-CM | POA: Diagnosis not present

## 2018-12-23 DIAGNOSIS — K219 Gastro-esophageal reflux disease without esophagitis: Secondary | ICD-10-CM | POA: Diagnosis not present

## 2018-12-23 DIAGNOSIS — K589 Irritable bowel syndrome without diarrhea: Secondary | ICD-10-CM | POA: Diagnosis not present

## 2018-12-23 DIAGNOSIS — R110 Nausea: Secondary | ICD-10-CM | POA: Diagnosis not present

## 2018-12-23 DIAGNOSIS — F411 Generalized anxiety disorder: Secondary | ICD-10-CM | POA: Diagnosis not present

## 2018-12-23 DIAGNOSIS — N318 Other neuromuscular dysfunction of bladder: Secondary | ICD-10-CM | POA: Diagnosis not present

## 2018-12-23 DIAGNOSIS — I700 Atherosclerosis of aorta: Secondary | ICD-10-CM | POA: Diagnosis not present

## 2018-12-23 DIAGNOSIS — R600 Localized edema: Secondary | ICD-10-CM | POA: Diagnosis not present

## 2018-12-23 DIAGNOSIS — N390 Urinary tract infection, site not specified: Secondary | ICD-10-CM | POA: Diagnosis not present

## 2018-12-23 DIAGNOSIS — R945 Abnormal results of liver function studies: Secondary | ICD-10-CM | POA: Diagnosis not present

## 2018-12-23 DIAGNOSIS — G609 Hereditary and idiopathic neuropathy, unspecified: Secondary | ICD-10-CM | POA: Diagnosis not present

## 2018-12-23 DIAGNOSIS — R918 Other nonspecific abnormal finding of lung field: Secondary | ICD-10-CM | POA: Diagnosis not present

## 2018-12-23 DIAGNOSIS — L930 Discoid lupus erythematosus: Secondary | ICD-10-CM | POA: Diagnosis not present

## 2019-01-09 DIAGNOSIS — F411 Generalized anxiety disorder: Secondary | ICD-10-CM | POA: Diagnosis not present

## 2019-01-09 DIAGNOSIS — G609 Hereditary and idiopathic neuropathy, unspecified: Secondary | ICD-10-CM | POA: Diagnosis not present

## 2019-01-09 DIAGNOSIS — R918 Other nonspecific abnormal finding of lung field: Secondary | ICD-10-CM | POA: Diagnosis not present

## 2019-01-09 DIAGNOSIS — K589 Irritable bowel syndrome without diarrhea: Secondary | ICD-10-CM | POA: Diagnosis not present

## 2019-01-09 DIAGNOSIS — K219 Gastro-esophageal reflux disease without esophagitis: Secondary | ICD-10-CM | POA: Diagnosis not present

## 2019-01-09 DIAGNOSIS — R110 Nausea: Secondary | ICD-10-CM | POA: Diagnosis not present

## 2019-01-09 DIAGNOSIS — I700 Atherosclerosis of aorta: Secondary | ICD-10-CM | POA: Diagnosis not present

## 2019-01-09 DIAGNOSIS — R945 Abnormal results of liver function studies: Secondary | ICD-10-CM | POA: Diagnosis not present

## 2019-01-09 DIAGNOSIS — L930 Discoid lupus erythematosus: Secondary | ICD-10-CM | POA: Diagnosis not present

## 2019-01-09 DIAGNOSIS — F5101 Primary insomnia: Secondary | ICD-10-CM | POA: Diagnosis not present

## 2019-01-09 DIAGNOSIS — N318 Other neuromuscular dysfunction of bladder: Secondary | ICD-10-CM | POA: Diagnosis not present

## 2019-01-09 DIAGNOSIS — R600 Localized edema: Secondary | ICD-10-CM | POA: Diagnosis not present

## 2019-02-06 DIAGNOSIS — L930 Discoid lupus erythematosus: Secondary | ICD-10-CM | POA: Diagnosis not present

## 2019-02-06 DIAGNOSIS — R945 Abnormal results of liver function studies: Secondary | ICD-10-CM | POA: Diagnosis not present

## 2019-02-06 DIAGNOSIS — G609 Hereditary and idiopathic neuropathy, unspecified: Secondary | ICD-10-CM | POA: Diagnosis not present

## 2019-02-06 DIAGNOSIS — F411 Generalized anxiety disorder: Secondary | ICD-10-CM | POA: Diagnosis not present

## 2019-02-06 DIAGNOSIS — R110 Nausea: Secondary | ICD-10-CM | POA: Diagnosis not present

## 2019-02-06 DIAGNOSIS — F5101 Primary insomnia: Secondary | ICD-10-CM | POA: Diagnosis not present

## 2019-02-06 DIAGNOSIS — R918 Other nonspecific abnormal finding of lung field: Secondary | ICD-10-CM | POA: Diagnosis not present

## 2019-02-06 DIAGNOSIS — R600 Localized edema: Secondary | ICD-10-CM | POA: Diagnosis not present

## 2019-02-06 DIAGNOSIS — I700 Atherosclerosis of aorta: Secondary | ICD-10-CM | POA: Diagnosis not present

## 2019-02-06 DIAGNOSIS — K219 Gastro-esophageal reflux disease without esophagitis: Secondary | ICD-10-CM | POA: Diagnosis not present

## 2019-02-06 DIAGNOSIS — K589 Irritable bowel syndrome without diarrhea: Secondary | ICD-10-CM | POA: Diagnosis not present

## 2019-02-06 DIAGNOSIS — N318 Other neuromuscular dysfunction of bladder: Secondary | ICD-10-CM | POA: Diagnosis not present

## 2019-02-24 IMAGING — CT CT CHEST W/O CM
2 of 3 series · 15 of 36 positions shown, 18 images · non-contrast
Comparison: Chest radiograph performed 02/08/2016

CLINICAL DATA: Chronic cough.  Initial encounter.

EXAM:
CT CHEST WITHOUT CONTRAST
TECHNIQUE: Multidetector CT imaging of the chest was performed following the
standard protocol without IV contrast.

[Series 2: thorax · axial · 0.58mm/px · z∈[-328,-44]mm · 12 of 168 slices shown, 15 images]
[im 13/168  mediastinal]
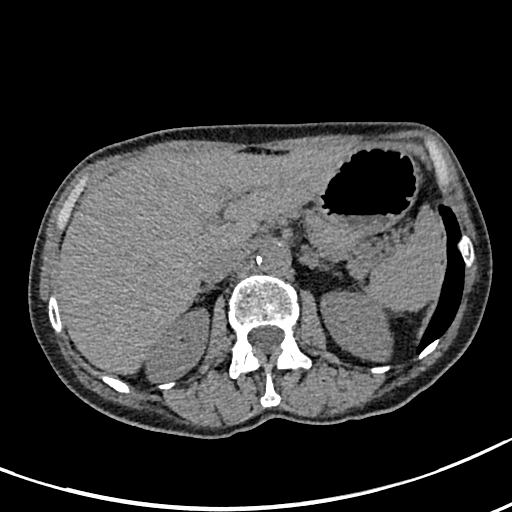
[im 13/168  lung]
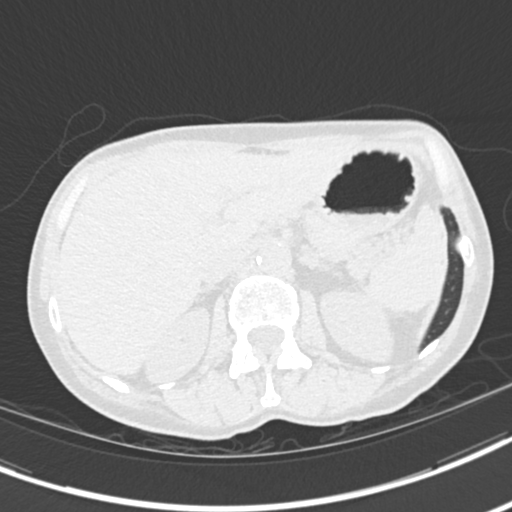
[im 25/168  lung]
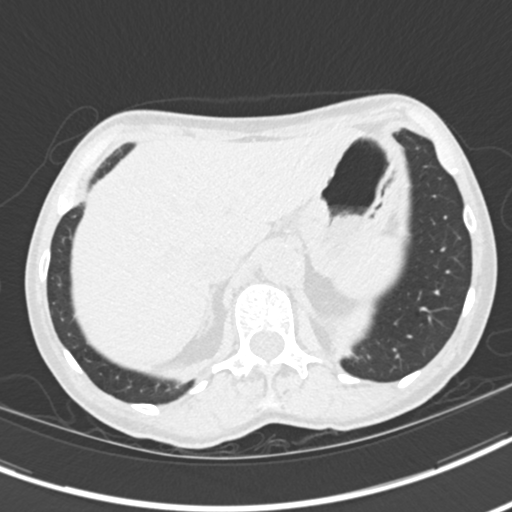
[im 38/168  lung]
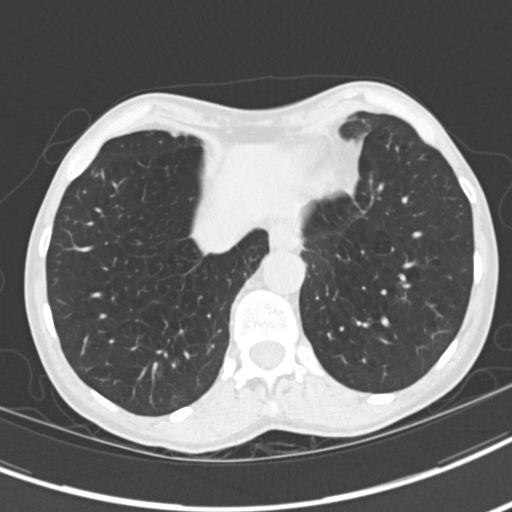
[im 50/168  lung]
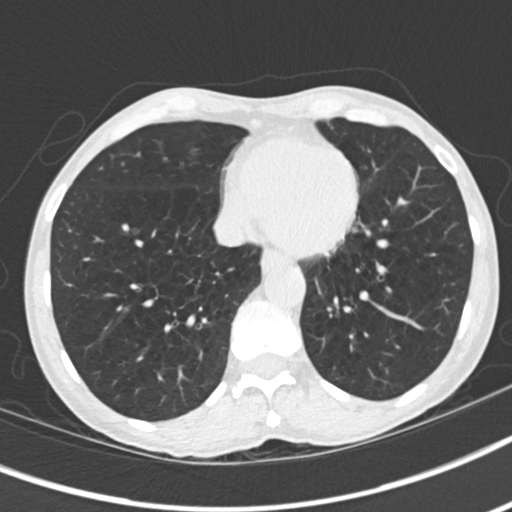
[im 62/168  mediastinal]
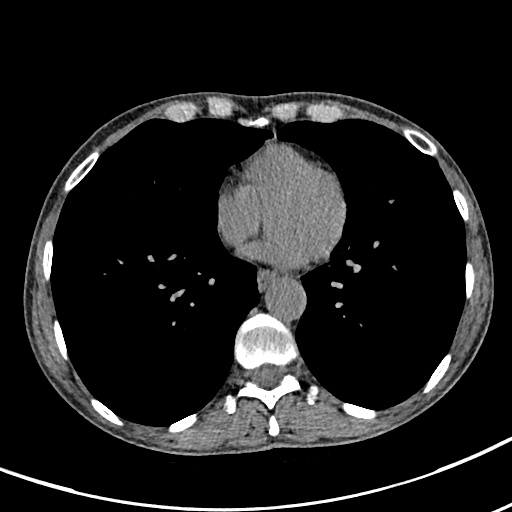
[im 62/168  lung]
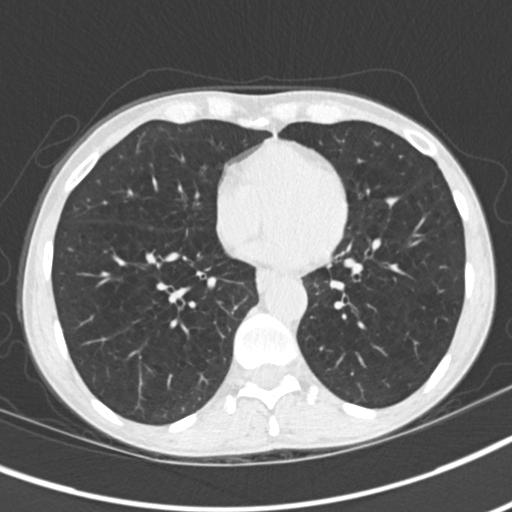
[im 75/168  lung]
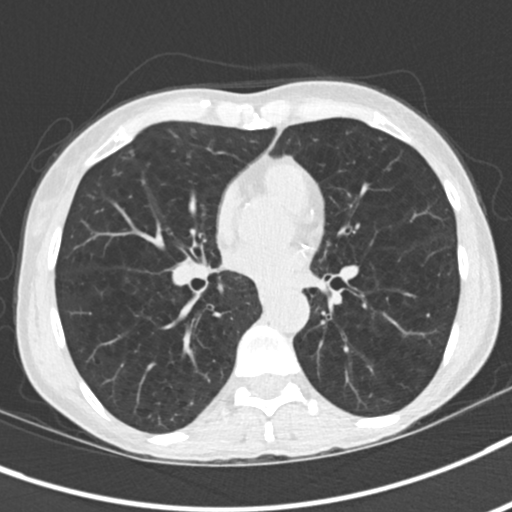
[im 93/168  lung]
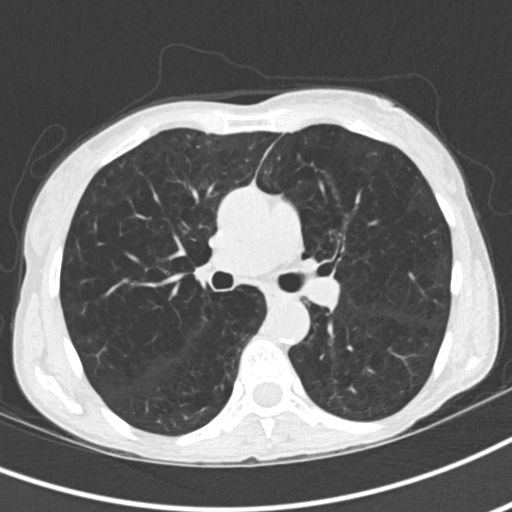
[im 106/168  lung]
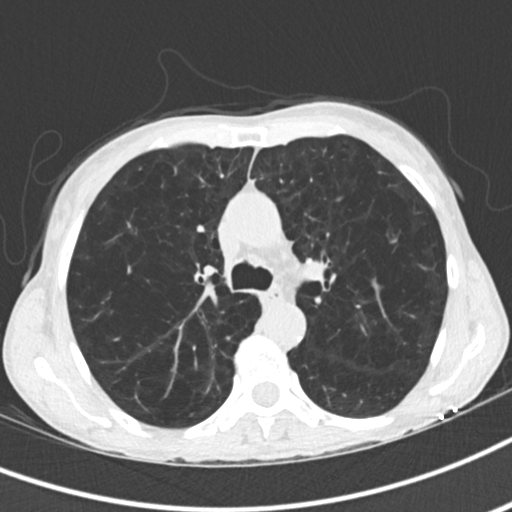
[im 118/168  mediastinal]
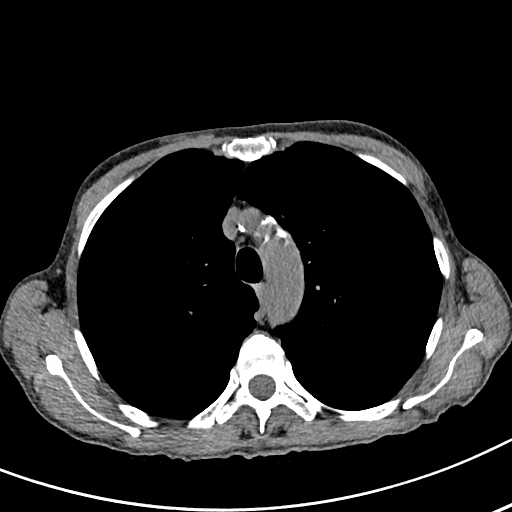
[im 118/168  lung]
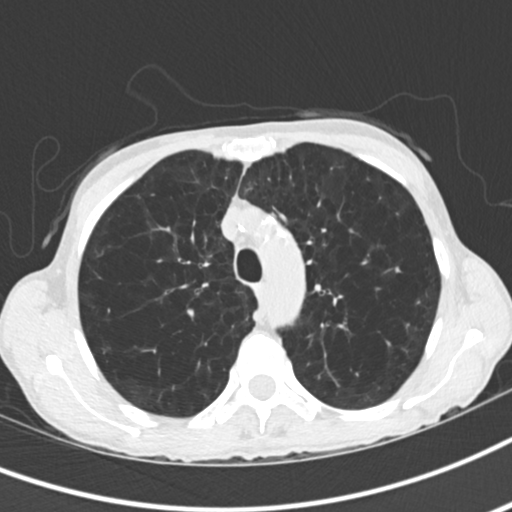
[im 130/168  lung]
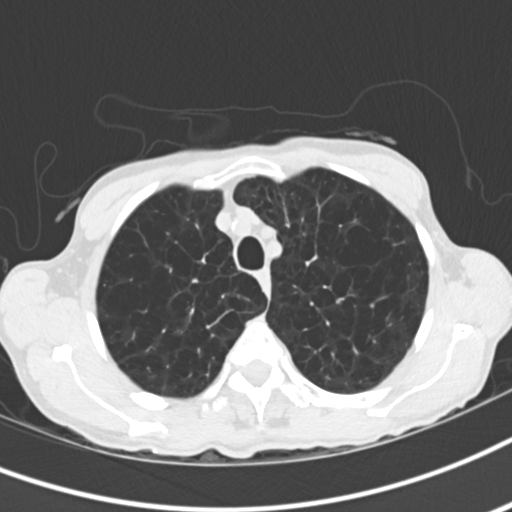
[im 143/168  lung]
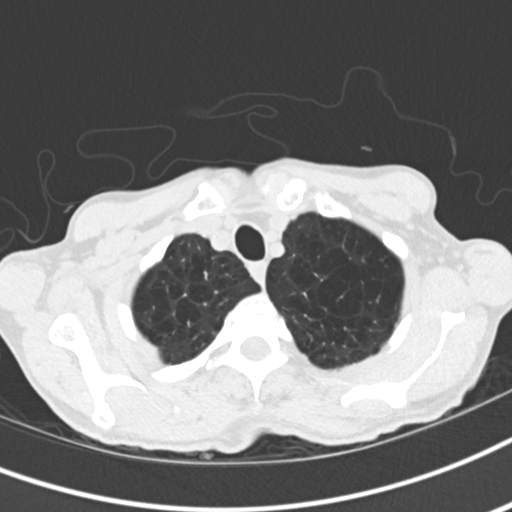
[im 155/168  lung]
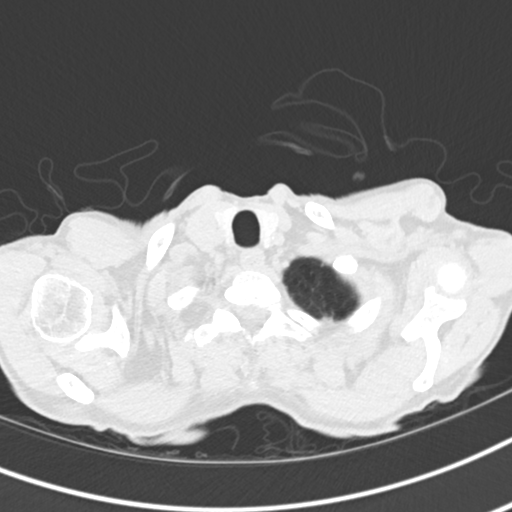

[Series 5: coronal · coronal · 0.56mm/px · 3 of 108 slices shown]
[im 22/108  lung]
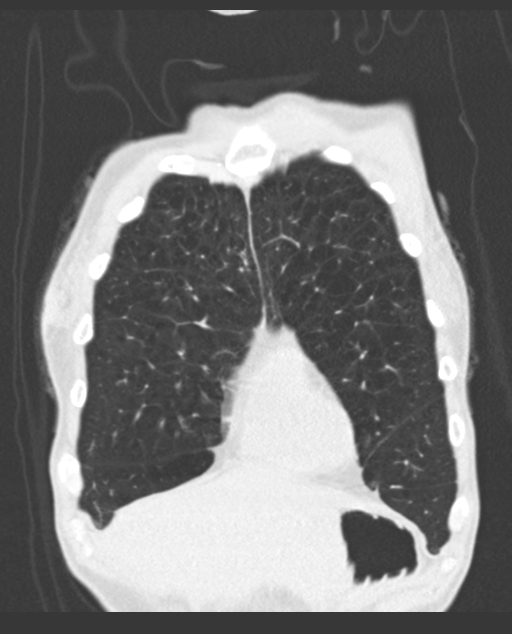
[im 43/108  lung]
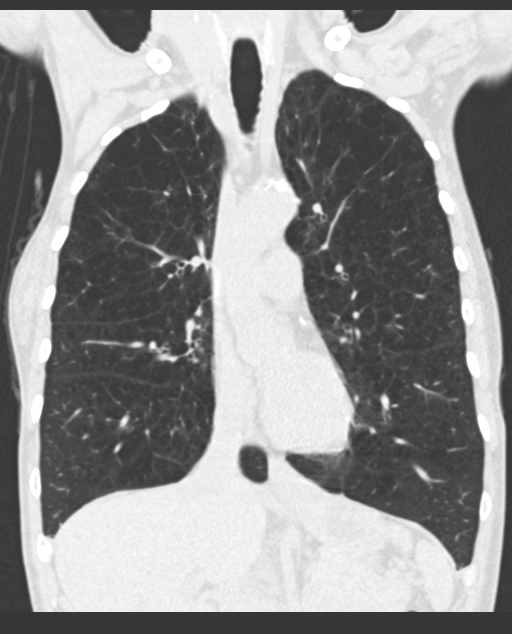
[im 65/108  lung]
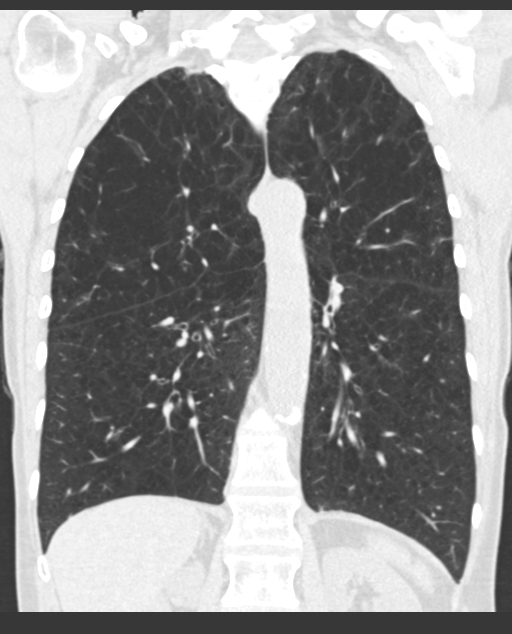

[15 of 36 positions shown; findings below may reference images not displayed]

FINDINGS: Cardiovascular: The heart is normal in size. Diffuse coronary artery
calcifications are seen. The thoracic aorta demonstrates scattered
calcification but is otherwise unremarkable. Scattered calcification
is seen along the proximal great vessels.

Mediastinum/Nodes: The mediastinum is otherwise unremarkable. No
mediastinal lymphadenopathy is seen. No pericardial effusion is
identified. The thyroid gland is unremarkable. No axial
lymphadenopathy is appreciated.

Lungs/Pleura: Bilateral emphysema is noted. No pleural effusion or
pneumothorax is seen. Mild scarring is noted at the lung apices. No
masses are identified.

Upper Abdomen: The visualized portions of the liver and spleen are
grossly unremarkable. The visualized portions of the pancreas,
adrenal glands and kidneys are within normal limits. Scattered
calcification is seen along the proximal abdominal aorta.

Musculoskeletal: No acute osseous abnormalities are identified. The
visualized musculature is unremarkable in appearance.
IMPRESSION: 1. Bilateral emphysema noted.  Lungs otherwise grossly clear.
2. Diffuse coronary artery calcifications noted.
3. Scattered aortic atherosclerosis.

## 2019-03-06 DIAGNOSIS — N318 Other neuromuscular dysfunction of bladder: Secondary | ICD-10-CM | POA: Diagnosis not present

## 2019-03-06 DIAGNOSIS — K219 Gastro-esophageal reflux disease without esophagitis: Secondary | ICD-10-CM | POA: Diagnosis not present

## 2019-03-06 DIAGNOSIS — I700 Atherosclerosis of aorta: Secondary | ICD-10-CM | POA: Diagnosis not present

## 2019-03-06 DIAGNOSIS — R600 Localized edema: Secondary | ICD-10-CM | POA: Diagnosis not present

## 2019-03-06 DIAGNOSIS — F5101 Primary insomnia: Secondary | ICD-10-CM | POA: Diagnosis not present

## 2019-03-06 DIAGNOSIS — R918 Other nonspecific abnormal finding of lung field: Secondary | ICD-10-CM | POA: Diagnosis not present

## 2019-03-06 DIAGNOSIS — R110 Nausea: Secondary | ICD-10-CM | POA: Diagnosis not present

## 2019-03-06 DIAGNOSIS — F411 Generalized anxiety disorder: Secondary | ICD-10-CM | POA: Diagnosis not present

## 2019-03-06 DIAGNOSIS — R945 Abnormal results of liver function studies: Secondary | ICD-10-CM | POA: Diagnosis not present

## 2019-03-06 DIAGNOSIS — G609 Hereditary and idiopathic neuropathy, unspecified: Secondary | ICD-10-CM | POA: Diagnosis not present

## 2019-03-06 DIAGNOSIS — K589 Irritable bowel syndrome without diarrhea: Secondary | ICD-10-CM | POA: Diagnosis not present

## 2019-03-06 DIAGNOSIS — L930 Discoid lupus erythematosus: Secondary | ICD-10-CM | POA: Diagnosis not present

## 2019-04-03 DIAGNOSIS — F5101 Primary insomnia: Secondary | ICD-10-CM | POA: Diagnosis not present

## 2019-04-03 DIAGNOSIS — R110 Nausea: Secondary | ICD-10-CM | POA: Diagnosis not present

## 2019-04-03 DIAGNOSIS — Z681 Body mass index (BMI) 19 or less, adult: Secondary | ICD-10-CM | POA: Diagnosis not present

## 2019-04-03 DIAGNOSIS — K219 Gastro-esophageal reflux disease without esophagitis: Secondary | ICD-10-CM | POA: Diagnosis not present

## 2019-04-03 DIAGNOSIS — F411 Generalized anxiety disorder: Secondary | ICD-10-CM | POA: Diagnosis not present

## 2019-04-03 DIAGNOSIS — R945 Abnormal results of liver function studies: Secondary | ICD-10-CM | POA: Diagnosis not present

## 2019-04-03 DIAGNOSIS — I700 Atherosclerosis of aorta: Secondary | ICD-10-CM | POA: Diagnosis not present

## 2019-04-03 DIAGNOSIS — L930 Discoid lupus erythematosus: Secondary | ICD-10-CM | POA: Diagnosis not present

## 2019-04-03 DIAGNOSIS — R600 Localized edema: Secondary | ICD-10-CM | POA: Diagnosis not present

## 2019-04-03 DIAGNOSIS — K589 Irritable bowel syndrome without diarrhea: Secondary | ICD-10-CM | POA: Diagnosis not present

## 2019-04-03 DIAGNOSIS — N318 Other neuromuscular dysfunction of bladder: Secondary | ICD-10-CM | POA: Diagnosis not present

## 2019-04-29 DIAGNOSIS — Z1231 Encounter for screening mammogram for malignant neoplasm of breast: Secondary | ICD-10-CM | POA: Diagnosis not present

## 2019-04-29 DIAGNOSIS — Z0000 Encounter for general adult medical examination without abnormal findings: Secondary | ICD-10-CM | POA: Diagnosis not present

## 2019-04-29 DIAGNOSIS — Z1331 Encounter for screening for depression: Secondary | ICD-10-CM | POA: Diagnosis not present

## 2019-04-29 DIAGNOSIS — Z9181 History of falling: Secondary | ICD-10-CM | POA: Diagnosis not present

## 2019-04-30 DIAGNOSIS — R110 Nausea: Secondary | ICD-10-CM | POA: Diagnosis not present

## 2019-04-30 DIAGNOSIS — F411 Generalized anxiety disorder: Secondary | ICD-10-CM | POA: Diagnosis not present

## 2019-04-30 DIAGNOSIS — R918 Other nonspecific abnormal finding of lung field: Secondary | ICD-10-CM | POA: Diagnosis not present

## 2019-04-30 DIAGNOSIS — G609 Hereditary and idiopathic neuropathy, unspecified: Secondary | ICD-10-CM | POA: Diagnosis not present

## 2019-04-30 DIAGNOSIS — L930 Discoid lupus erythematosus: Secondary | ICD-10-CM | POA: Diagnosis not present

## 2019-04-30 DIAGNOSIS — I700 Atherosclerosis of aorta: Secondary | ICD-10-CM | POA: Diagnosis not present

## 2019-04-30 DIAGNOSIS — R600 Localized edema: Secondary | ICD-10-CM | POA: Diagnosis not present

## 2019-04-30 DIAGNOSIS — R945 Abnormal results of liver function studies: Secondary | ICD-10-CM | POA: Diagnosis not present

## 2019-04-30 DIAGNOSIS — K219 Gastro-esophageal reflux disease without esophagitis: Secondary | ICD-10-CM | POA: Diagnosis not present

## 2019-04-30 DIAGNOSIS — N318 Other neuromuscular dysfunction of bladder: Secondary | ICD-10-CM | POA: Diagnosis not present

## 2019-04-30 DIAGNOSIS — K589 Irritable bowel syndrome without diarrhea: Secondary | ICD-10-CM | POA: Diagnosis not present

## 2019-04-30 DIAGNOSIS — F5101 Primary insomnia: Secondary | ICD-10-CM | POA: Diagnosis not present

## 2019-05-28 DIAGNOSIS — R110 Nausea: Secondary | ICD-10-CM | POA: Diagnosis not present

## 2019-05-28 DIAGNOSIS — F411 Generalized anxiety disorder: Secondary | ICD-10-CM | POA: Diagnosis not present

## 2019-05-28 DIAGNOSIS — N318 Other neuromuscular dysfunction of bladder: Secondary | ICD-10-CM | POA: Diagnosis not present

## 2019-05-28 DIAGNOSIS — R945 Abnormal results of liver function studies: Secondary | ICD-10-CM | POA: Diagnosis not present

## 2019-05-28 DIAGNOSIS — K219 Gastro-esophageal reflux disease without esophagitis: Secondary | ICD-10-CM | POA: Diagnosis not present

## 2019-05-28 DIAGNOSIS — G609 Hereditary and idiopathic neuropathy, unspecified: Secondary | ICD-10-CM | POA: Diagnosis not present

## 2019-05-28 DIAGNOSIS — L930 Discoid lupus erythematosus: Secondary | ICD-10-CM | POA: Diagnosis not present

## 2019-05-28 DIAGNOSIS — F5101 Primary insomnia: Secondary | ICD-10-CM | POA: Diagnosis not present

## 2019-05-28 DIAGNOSIS — I700 Atherosclerosis of aorta: Secondary | ICD-10-CM | POA: Diagnosis not present

## 2019-05-28 DIAGNOSIS — R600 Localized edema: Secondary | ICD-10-CM | POA: Diagnosis not present

## 2019-05-28 DIAGNOSIS — R918 Other nonspecific abnormal finding of lung field: Secondary | ICD-10-CM | POA: Diagnosis not present

## 2019-05-28 DIAGNOSIS — K589 Irritable bowel syndrome without diarrhea: Secondary | ICD-10-CM | POA: Diagnosis not present

## 2019-06-25 DIAGNOSIS — R600 Localized edema: Secondary | ICD-10-CM | POA: Diagnosis not present

## 2019-06-25 DIAGNOSIS — R918 Other nonspecific abnormal finding of lung field: Secondary | ICD-10-CM | POA: Diagnosis not present

## 2019-06-25 DIAGNOSIS — G609 Hereditary and idiopathic neuropathy, unspecified: Secondary | ICD-10-CM | POA: Diagnosis not present

## 2019-06-25 DIAGNOSIS — R945 Abnormal results of liver function studies: Secondary | ICD-10-CM | POA: Diagnosis not present

## 2019-06-25 DIAGNOSIS — K589 Irritable bowel syndrome without diarrhea: Secondary | ICD-10-CM | POA: Diagnosis not present

## 2019-06-25 DIAGNOSIS — F411 Generalized anxiety disorder: Secondary | ICD-10-CM | POA: Diagnosis not present

## 2019-06-25 DIAGNOSIS — N318 Other neuromuscular dysfunction of bladder: Secondary | ICD-10-CM | POA: Diagnosis not present

## 2019-06-25 DIAGNOSIS — R110 Nausea: Secondary | ICD-10-CM | POA: Diagnosis not present

## 2019-06-25 DIAGNOSIS — K219 Gastro-esophageal reflux disease without esophagitis: Secondary | ICD-10-CM | POA: Diagnosis not present

## 2019-06-25 DIAGNOSIS — L930 Discoid lupus erythematosus: Secondary | ICD-10-CM | POA: Diagnosis not present

## 2019-06-25 DIAGNOSIS — F5101 Primary insomnia: Secondary | ICD-10-CM | POA: Diagnosis not present

## 2019-07-23 DIAGNOSIS — I700 Atherosclerosis of aorta: Secondary | ICD-10-CM | POA: Diagnosis not present

## 2019-07-23 DIAGNOSIS — F5101 Primary insomnia: Secondary | ICD-10-CM | POA: Diagnosis not present

## 2019-07-23 DIAGNOSIS — K219 Gastro-esophageal reflux disease without esophagitis: Secondary | ICD-10-CM | POA: Diagnosis not present

## 2019-07-23 DIAGNOSIS — R945 Abnormal results of liver function studies: Secondary | ICD-10-CM | POA: Diagnosis not present

## 2019-07-23 DIAGNOSIS — F411 Generalized anxiety disorder: Secondary | ICD-10-CM | POA: Diagnosis not present

## 2019-07-23 DIAGNOSIS — G609 Hereditary and idiopathic neuropathy, unspecified: Secondary | ICD-10-CM | POA: Diagnosis not present

## 2019-07-23 DIAGNOSIS — L930 Discoid lupus erythematosus: Secondary | ICD-10-CM | POA: Diagnosis not present

## 2019-07-23 DIAGNOSIS — R110 Nausea: Secondary | ICD-10-CM | POA: Diagnosis not present

## 2019-07-23 DIAGNOSIS — K589 Irritable bowel syndrome without diarrhea: Secondary | ICD-10-CM | POA: Diagnosis not present

## 2019-07-23 DIAGNOSIS — N318 Other neuromuscular dysfunction of bladder: Secondary | ICD-10-CM | POA: Diagnosis not present

## 2019-07-23 DIAGNOSIS — R600 Localized edema: Secondary | ICD-10-CM | POA: Diagnosis not present

## 2019-07-23 DIAGNOSIS — R918 Other nonspecific abnormal finding of lung field: Secondary | ICD-10-CM | POA: Diagnosis not present

## 2019-08-20 DIAGNOSIS — N318 Other neuromuscular dysfunction of bladder: Secondary | ICD-10-CM | POA: Diagnosis not present

## 2019-08-20 DIAGNOSIS — R600 Localized edema: Secondary | ICD-10-CM | POA: Diagnosis not present

## 2019-08-20 DIAGNOSIS — F5101 Primary insomnia: Secondary | ICD-10-CM | POA: Diagnosis not present

## 2019-08-20 DIAGNOSIS — K589 Irritable bowel syndrome without diarrhea: Secondary | ICD-10-CM | POA: Diagnosis not present

## 2019-08-20 DIAGNOSIS — R918 Other nonspecific abnormal finding of lung field: Secondary | ICD-10-CM | POA: Diagnosis not present

## 2019-08-20 DIAGNOSIS — I700 Atherosclerosis of aorta: Secondary | ICD-10-CM | POA: Diagnosis not present

## 2019-08-20 DIAGNOSIS — L930 Discoid lupus erythematosus: Secondary | ICD-10-CM | POA: Diagnosis not present

## 2019-08-20 DIAGNOSIS — K219 Gastro-esophageal reflux disease without esophagitis: Secondary | ICD-10-CM | POA: Diagnosis not present

## 2019-08-20 DIAGNOSIS — R945 Abnormal results of liver function studies: Secondary | ICD-10-CM | POA: Diagnosis not present

## 2019-08-20 DIAGNOSIS — G609 Hereditary and idiopathic neuropathy, unspecified: Secondary | ICD-10-CM | POA: Diagnosis not present

## 2019-08-20 DIAGNOSIS — R110 Nausea: Secondary | ICD-10-CM | POA: Diagnosis not present

## 2019-08-20 DIAGNOSIS — F411 Generalized anxiety disorder: Secondary | ICD-10-CM | POA: Diagnosis not present

## 2019-09-17 DIAGNOSIS — K219 Gastro-esophageal reflux disease without esophagitis: Secondary | ICD-10-CM | POA: Diagnosis not present

## 2019-09-17 DIAGNOSIS — R600 Localized edema: Secondary | ICD-10-CM | POA: Diagnosis not present

## 2019-09-17 DIAGNOSIS — R110 Nausea: Secondary | ICD-10-CM | POA: Diagnosis not present

## 2019-09-17 DIAGNOSIS — J449 Chronic obstructive pulmonary disease, unspecified: Secondary | ICD-10-CM | POA: Diagnosis not present

## 2019-09-17 DIAGNOSIS — N318 Other neuromuscular dysfunction of bladder: Secondary | ICD-10-CM | POA: Diagnosis not present

## 2019-09-17 DIAGNOSIS — F5101 Primary insomnia: Secondary | ICD-10-CM | POA: Diagnosis not present

## 2019-09-17 DIAGNOSIS — G609 Hereditary and idiopathic neuropathy, unspecified: Secondary | ICD-10-CM | POA: Diagnosis not present

## 2019-09-17 DIAGNOSIS — L930 Discoid lupus erythematosus: Secondary | ICD-10-CM | POA: Diagnosis not present

## 2019-09-17 DIAGNOSIS — R945 Abnormal results of liver function studies: Secondary | ICD-10-CM | POA: Diagnosis not present

## 2019-09-17 DIAGNOSIS — K589 Irritable bowel syndrome without diarrhea: Secondary | ICD-10-CM | POA: Diagnosis not present

## 2019-09-17 DIAGNOSIS — F411 Generalized anxiety disorder: Secondary | ICD-10-CM | POA: Diagnosis not present

## 2019-09-17 DIAGNOSIS — I700 Atherosclerosis of aorta: Secondary | ICD-10-CM | POA: Diagnosis not present

## 2019-10-15 DIAGNOSIS — B360 Pityriasis versicolor: Secondary | ICD-10-CM | POA: Diagnosis not present

## 2019-10-15 DIAGNOSIS — K589 Irritable bowel syndrome without diarrhea: Secondary | ICD-10-CM | POA: Diagnosis not present

## 2019-10-15 DIAGNOSIS — R945 Abnormal results of liver function studies: Secondary | ICD-10-CM | POA: Diagnosis not present

## 2019-10-15 DIAGNOSIS — F5101 Primary insomnia: Secondary | ICD-10-CM | POA: Diagnosis not present

## 2019-10-15 DIAGNOSIS — R110 Nausea: Secondary | ICD-10-CM | POA: Diagnosis not present

## 2019-10-15 DIAGNOSIS — N318 Other neuromuscular dysfunction of bladder: Secondary | ICD-10-CM | POA: Diagnosis not present

## 2019-10-15 DIAGNOSIS — K219 Gastro-esophageal reflux disease without esophagitis: Secondary | ICD-10-CM | POA: Diagnosis not present

## 2019-10-15 DIAGNOSIS — R600 Localized edema: Secondary | ICD-10-CM | POA: Diagnosis not present

## 2019-10-15 DIAGNOSIS — J449 Chronic obstructive pulmonary disease, unspecified: Secondary | ICD-10-CM | POA: Diagnosis not present

## 2019-10-15 DIAGNOSIS — L930 Discoid lupus erythematosus: Secondary | ICD-10-CM | POA: Diagnosis not present

## 2019-10-15 DIAGNOSIS — F411 Generalized anxiety disorder: Secondary | ICD-10-CM | POA: Diagnosis not present

## 2019-10-15 DIAGNOSIS — I700 Atherosclerosis of aorta: Secondary | ICD-10-CM | POA: Diagnosis not present

## 2019-11-12 DIAGNOSIS — R945 Abnormal results of liver function studies: Secondary | ICD-10-CM | POA: Diagnosis not present

## 2019-11-12 DIAGNOSIS — K219 Gastro-esophageal reflux disease without esophagitis: Secondary | ICD-10-CM | POA: Diagnosis not present

## 2019-11-12 DIAGNOSIS — F5101 Primary insomnia: Secondary | ICD-10-CM | POA: Diagnosis not present

## 2019-11-12 DIAGNOSIS — L930 Discoid lupus erythematosus: Secondary | ICD-10-CM | POA: Diagnosis not present

## 2019-11-12 DIAGNOSIS — R600 Localized edema: Secondary | ICD-10-CM | POA: Diagnosis not present

## 2019-11-12 DIAGNOSIS — F411 Generalized anxiety disorder: Secondary | ICD-10-CM | POA: Diagnosis not present

## 2019-11-12 DIAGNOSIS — K589 Irritable bowel syndrome without diarrhea: Secondary | ICD-10-CM | POA: Diagnosis not present

## 2019-11-12 DIAGNOSIS — G609 Hereditary and idiopathic neuropathy, unspecified: Secondary | ICD-10-CM | POA: Diagnosis not present

## 2019-11-12 DIAGNOSIS — N318 Other neuromuscular dysfunction of bladder: Secondary | ICD-10-CM | POA: Diagnosis not present

## 2019-11-12 DIAGNOSIS — I700 Atherosclerosis of aorta: Secondary | ICD-10-CM | POA: Diagnosis not present

## 2019-11-12 DIAGNOSIS — J449 Chronic obstructive pulmonary disease, unspecified: Secondary | ICD-10-CM | POA: Diagnosis not present

## 2019-11-12 DIAGNOSIS — R110 Nausea: Secondary | ICD-10-CM | POA: Diagnosis not present

## 2019-12-10 DIAGNOSIS — F5101 Primary insomnia: Secondary | ICD-10-CM | POA: Diagnosis not present

## 2019-12-10 DIAGNOSIS — R945 Abnormal results of liver function studies: Secondary | ICD-10-CM | POA: Diagnosis not present

## 2019-12-10 DIAGNOSIS — R600 Localized edema: Secondary | ICD-10-CM | POA: Diagnosis not present

## 2019-12-10 DIAGNOSIS — K589 Irritable bowel syndrome without diarrhea: Secondary | ICD-10-CM | POA: Diagnosis not present

## 2019-12-10 DIAGNOSIS — J449 Chronic obstructive pulmonary disease, unspecified: Secondary | ICD-10-CM | POA: Diagnosis not present

## 2019-12-10 DIAGNOSIS — G609 Hereditary and idiopathic neuropathy, unspecified: Secondary | ICD-10-CM | POA: Diagnosis not present

## 2019-12-10 DIAGNOSIS — I700 Atherosclerosis of aorta: Secondary | ICD-10-CM | POA: Diagnosis not present

## 2019-12-10 DIAGNOSIS — R110 Nausea: Secondary | ICD-10-CM | POA: Diagnosis not present

## 2019-12-10 DIAGNOSIS — F411 Generalized anxiety disorder: Secondary | ICD-10-CM | POA: Diagnosis not present

## 2019-12-10 DIAGNOSIS — L930 Discoid lupus erythematosus: Secondary | ICD-10-CM | POA: Diagnosis not present

## 2019-12-10 DIAGNOSIS — N318 Other neuromuscular dysfunction of bladder: Secondary | ICD-10-CM | POA: Diagnosis not present

## 2019-12-10 DIAGNOSIS — K219 Gastro-esophageal reflux disease without esophagitis: Secondary | ICD-10-CM | POA: Diagnosis not present

## 2020-01-07 DIAGNOSIS — R110 Nausea: Secondary | ICD-10-CM | POA: Diagnosis not present

## 2020-01-07 DIAGNOSIS — G609 Hereditary and idiopathic neuropathy, unspecified: Secondary | ICD-10-CM | POA: Diagnosis not present

## 2020-01-07 DIAGNOSIS — N318 Other neuromuscular dysfunction of bladder: Secondary | ICD-10-CM | POA: Diagnosis not present

## 2020-01-07 DIAGNOSIS — R600 Localized edema: Secondary | ICD-10-CM | POA: Diagnosis not present

## 2020-01-07 DIAGNOSIS — K219 Gastro-esophageal reflux disease without esophagitis: Secondary | ICD-10-CM | POA: Diagnosis not present

## 2020-01-07 DIAGNOSIS — R945 Abnormal results of liver function studies: Secondary | ICD-10-CM | POA: Diagnosis not present

## 2020-01-07 DIAGNOSIS — L930 Discoid lupus erythematosus: Secondary | ICD-10-CM | POA: Diagnosis not present

## 2020-01-07 DIAGNOSIS — I700 Atherosclerosis of aorta: Secondary | ICD-10-CM | POA: Diagnosis not present

## 2020-01-07 DIAGNOSIS — F411 Generalized anxiety disorder: Secondary | ICD-10-CM | POA: Diagnosis not present

## 2020-01-07 DIAGNOSIS — J449 Chronic obstructive pulmonary disease, unspecified: Secondary | ICD-10-CM | POA: Diagnosis not present

## 2020-01-07 DIAGNOSIS — K589 Irritable bowel syndrome without diarrhea: Secondary | ICD-10-CM | POA: Diagnosis not present

## 2020-01-07 DIAGNOSIS — F5101 Primary insomnia: Secondary | ICD-10-CM | POA: Diagnosis not present

## 2020-02-04 DIAGNOSIS — K219 Gastro-esophageal reflux disease without esophagitis: Secondary | ICD-10-CM | POA: Diagnosis not present

## 2020-02-04 DIAGNOSIS — R110 Nausea: Secondary | ICD-10-CM | POA: Diagnosis not present

## 2020-02-04 DIAGNOSIS — R600 Localized edema: Secondary | ICD-10-CM | POA: Diagnosis not present

## 2020-02-04 DIAGNOSIS — J449 Chronic obstructive pulmonary disease, unspecified: Secondary | ICD-10-CM | POA: Diagnosis not present

## 2020-02-04 DIAGNOSIS — F5101 Primary insomnia: Secondary | ICD-10-CM | POA: Diagnosis not present

## 2020-02-04 DIAGNOSIS — N318 Other neuromuscular dysfunction of bladder: Secondary | ICD-10-CM | POA: Diagnosis not present

## 2020-02-04 DIAGNOSIS — I700 Atherosclerosis of aorta: Secondary | ICD-10-CM | POA: Diagnosis not present

## 2020-02-04 DIAGNOSIS — L930 Discoid lupus erythematosus: Secondary | ICD-10-CM | POA: Diagnosis not present

## 2020-02-04 DIAGNOSIS — R945 Abnormal results of liver function studies: Secondary | ICD-10-CM | POA: Diagnosis not present

## 2020-02-04 DIAGNOSIS — K589 Irritable bowel syndrome without diarrhea: Secondary | ICD-10-CM | POA: Diagnosis not present

## 2020-02-04 DIAGNOSIS — G609 Hereditary and idiopathic neuropathy, unspecified: Secondary | ICD-10-CM | POA: Diagnosis not present

## 2020-02-04 DIAGNOSIS — F411 Generalized anxiety disorder: Secondary | ICD-10-CM | POA: Diagnosis not present

## 2020-03-03 DIAGNOSIS — K589 Irritable bowel syndrome without diarrhea: Secondary | ICD-10-CM | POA: Diagnosis not present

## 2020-03-03 DIAGNOSIS — R110 Nausea: Secondary | ICD-10-CM | POA: Diagnosis not present

## 2020-03-03 DIAGNOSIS — F411 Generalized anxiety disorder: Secondary | ICD-10-CM | POA: Diagnosis not present

## 2020-03-03 DIAGNOSIS — N318 Other neuromuscular dysfunction of bladder: Secondary | ICD-10-CM | POA: Diagnosis not present

## 2020-03-03 DIAGNOSIS — R945 Abnormal results of liver function studies: Secondary | ICD-10-CM | POA: Diagnosis not present

## 2020-03-03 DIAGNOSIS — F5101 Primary insomnia: Secondary | ICD-10-CM | POA: Diagnosis not present

## 2020-03-03 DIAGNOSIS — L930 Discoid lupus erythematosus: Secondary | ICD-10-CM | POA: Diagnosis not present

## 2020-03-03 DIAGNOSIS — J449 Chronic obstructive pulmonary disease, unspecified: Secondary | ICD-10-CM | POA: Diagnosis not present

## 2020-03-03 DIAGNOSIS — Z681 Body mass index (BMI) 19 or less, adult: Secondary | ICD-10-CM | POA: Diagnosis not present

## 2020-03-03 DIAGNOSIS — R600 Localized edema: Secondary | ICD-10-CM | POA: Diagnosis not present

## 2020-03-03 DIAGNOSIS — K219 Gastro-esophageal reflux disease without esophagitis: Secondary | ICD-10-CM | POA: Diagnosis not present

## 2020-03-03 DIAGNOSIS — I700 Atherosclerosis of aorta: Secondary | ICD-10-CM | POA: Diagnosis not present

## 2020-03-31 DIAGNOSIS — K219 Gastro-esophageal reflux disease without esophagitis: Secondary | ICD-10-CM | POA: Diagnosis not present

## 2020-03-31 DIAGNOSIS — R110 Nausea: Secondary | ICD-10-CM | POA: Diagnosis not present

## 2020-03-31 DIAGNOSIS — F5101 Primary insomnia: Secondary | ICD-10-CM | POA: Diagnosis not present

## 2020-03-31 DIAGNOSIS — R945 Abnormal results of liver function studies: Secondary | ICD-10-CM | POA: Diagnosis not present

## 2020-03-31 DIAGNOSIS — F411 Generalized anxiety disorder: Secondary | ICD-10-CM | POA: Diagnosis not present

## 2020-03-31 DIAGNOSIS — I700 Atherosclerosis of aorta: Secondary | ICD-10-CM | POA: Diagnosis not present

## 2020-03-31 DIAGNOSIS — N318 Other neuromuscular dysfunction of bladder: Secondary | ICD-10-CM | POA: Diagnosis not present

## 2020-03-31 DIAGNOSIS — K589 Irritable bowel syndrome without diarrhea: Secondary | ICD-10-CM | POA: Diagnosis not present

## 2020-03-31 DIAGNOSIS — L930 Discoid lupus erythematosus: Secondary | ICD-10-CM | POA: Diagnosis not present

## 2020-03-31 DIAGNOSIS — R600 Localized edema: Secondary | ICD-10-CM | POA: Diagnosis not present

## 2020-03-31 DIAGNOSIS — G609 Hereditary and idiopathic neuropathy, unspecified: Secondary | ICD-10-CM | POA: Diagnosis not present

## 2020-03-31 DIAGNOSIS — J449 Chronic obstructive pulmonary disease, unspecified: Secondary | ICD-10-CM | POA: Diagnosis not present

## 2020-04-14 DIAGNOSIS — J449 Chronic obstructive pulmonary disease, unspecified: Secondary | ICD-10-CM | POA: Diagnosis not present

## 2020-04-14 DIAGNOSIS — F411 Generalized anxiety disorder: Secondary | ICD-10-CM | POA: Diagnosis not present

## 2020-04-14 DIAGNOSIS — L930 Discoid lupus erythematosus: Secondary | ICD-10-CM | POA: Diagnosis not present

## 2020-04-14 DIAGNOSIS — R945 Abnormal results of liver function studies: Secondary | ICD-10-CM | POA: Diagnosis not present

## 2020-04-14 DIAGNOSIS — R600 Localized edema: Secondary | ICD-10-CM | POA: Diagnosis not present

## 2020-04-14 DIAGNOSIS — N318 Other neuromuscular dysfunction of bladder: Secondary | ICD-10-CM | POA: Diagnosis not present

## 2020-04-14 DIAGNOSIS — G609 Hereditary and idiopathic neuropathy, unspecified: Secondary | ICD-10-CM | POA: Diagnosis not present

## 2020-04-14 DIAGNOSIS — I700 Atherosclerosis of aorta: Secondary | ICD-10-CM | POA: Diagnosis not present

## 2020-04-14 DIAGNOSIS — K589 Irritable bowel syndrome without diarrhea: Secondary | ICD-10-CM | POA: Diagnosis not present

## 2020-04-14 DIAGNOSIS — R110 Nausea: Secondary | ICD-10-CM | POA: Diagnosis not present

## 2020-04-14 DIAGNOSIS — R918 Other nonspecific abnormal finding of lung field: Secondary | ICD-10-CM | POA: Diagnosis not present

## 2020-04-14 DIAGNOSIS — K219 Gastro-esophageal reflux disease without esophagitis: Secondary | ICD-10-CM | POA: Diagnosis not present

## 2020-04-29 DIAGNOSIS — L930 Discoid lupus erythematosus: Secondary | ICD-10-CM | POA: Diagnosis not present

## 2020-04-29 DIAGNOSIS — K219 Gastro-esophageal reflux disease without esophagitis: Secondary | ICD-10-CM | POA: Diagnosis not present

## 2020-04-29 DIAGNOSIS — M159 Polyosteoarthritis, unspecified: Secondary | ICD-10-CM | POA: Diagnosis not present

## 2020-04-29 DIAGNOSIS — K589 Irritable bowel syndrome without diarrhea: Secondary | ICD-10-CM | POA: Diagnosis not present

## 2020-04-29 DIAGNOSIS — R600 Localized edema: Secondary | ICD-10-CM | POA: Diagnosis not present

## 2020-04-29 DIAGNOSIS — R945 Abnormal results of liver function studies: Secondary | ICD-10-CM | POA: Diagnosis not present

## 2020-04-29 DIAGNOSIS — J449 Chronic obstructive pulmonary disease, unspecified: Secondary | ICD-10-CM | POA: Diagnosis not present

## 2020-04-29 DIAGNOSIS — N318 Other neuromuscular dysfunction of bladder: Secondary | ICD-10-CM | POA: Diagnosis not present

## 2020-04-29 DIAGNOSIS — F5101 Primary insomnia: Secondary | ICD-10-CM | POA: Diagnosis not present

## 2020-04-29 DIAGNOSIS — R110 Nausea: Secondary | ICD-10-CM | POA: Diagnosis not present

## 2020-04-29 DIAGNOSIS — I700 Atherosclerosis of aorta: Secondary | ICD-10-CM | POA: Diagnosis not present

## 2020-04-29 DIAGNOSIS — F411 Generalized anxiety disorder: Secondary | ICD-10-CM | POA: Diagnosis not present

## 2020-05-14 DIAGNOSIS — K219 Gastro-esophageal reflux disease without esophagitis: Secondary | ICD-10-CM | POA: Diagnosis not present

## 2020-05-14 DIAGNOSIS — R110 Nausea: Secondary | ICD-10-CM | POA: Diagnosis not present

## 2020-05-14 DIAGNOSIS — J449 Chronic obstructive pulmonary disease, unspecified: Secondary | ICD-10-CM | POA: Diagnosis not present

## 2020-05-14 DIAGNOSIS — F5101 Primary insomnia: Secondary | ICD-10-CM | POA: Diagnosis not present

## 2020-05-14 DIAGNOSIS — N318 Other neuromuscular dysfunction of bladder: Secondary | ICD-10-CM | POA: Diagnosis not present

## 2020-05-14 DIAGNOSIS — K589 Irritable bowel syndrome without diarrhea: Secondary | ICD-10-CM | POA: Diagnosis not present

## 2020-05-14 DIAGNOSIS — F411 Generalized anxiety disorder: Secondary | ICD-10-CM | POA: Diagnosis not present

## 2020-05-14 DIAGNOSIS — M159 Polyosteoarthritis, unspecified: Secondary | ICD-10-CM | POA: Diagnosis not present

## 2020-05-14 DIAGNOSIS — R600 Localized edema: Secondary | ICD-10-CM | POA: Diagnosis not present

## 2020-05-14 DIAGNOSIS — L930 Discoid lupus erythematosus: Secondary | ICD-10-CM | POA: Diagnosis not present

## 2020-05-14 DIAGNOSIS — R945 Abnormal results of liver function studies: Secondary | ICD-10-CM | POA: Diagnosis not present

## 2020-05-14 DIAGNOSIS — I700 Atherosclerosis of aorta: Secondary | ICD-10-CM | POA: Diagnosis not present

## 2020-05-28 DIAGNOSIS — K589 Irritable bowel syndrome without diarrhea: Secondary | ICD-10-CM | POA: Diagnosis not present

## 2020-05-28 DIAGNOSIS — R600 Localized edema: Secondary | ICD-10-CM | POA: Diagnosis not present

## 2020-05-28 DIAGNOSIS — F411 Generalized anxiety disorder: Secondary | ICD-10-CM | POA: Diagnosis not present

## 2020-05-28 DIAGNOSIS — L930 Discoid lupus erythematosus: Secondary | ICD-10-CM | POA: Diagnosis not present

## 2020-05-28 DIAGNOSIS — R945 Abnormal results of liver function studies: Secondary | ICD-10-CM | POA: Diagnosis not present

## 2020-05-28 DIAGNOSIS — K219 Gastro-esophageal reflux disease without esophagitis: Secondary | ICD-10-CM | POA: Diagnosis not present

## 2020-05-28 DIAGNOSIS — I700 Atherosclerosis of aorta: Secondary | ICD-10-CM | POA: Diagnosis not present

## 2020-05-28 DIAGNOSIS — N318 Other neuromuscular dysfunction of bladder: Secondary | ICD-10-CM | POA: Diagnosis not present

## 2020-05-28 DIAGNOSIS — J449 Chronic obstructive pulmonary disease, unspecified: Secondary | ICD-10-CM | POA: Diagnosis not present

## 2020-05-28 DIAGNOSIS — M159 Polyosteoarthritis, unspecified: Secondary | ICD-10-CM | POA: Diagnosis not present

## 2020-05-28 DIAGNOSIS — R110 Nausea: Secondary | ICD-10-CM | POA: Diagnosis not present

## 2020-05-28 DIAGNOSIS — F5101 Primary insomnia: Secondary | ICD-10-CM | POA: Diagnosis not present

## 2020-06-25 DIAGNOSIS — K589 Irritable bowel syndrome without diarrhea: Secondary | ICD-10-CM | POA: Diagnosis not present

## 2020-06-25 DIAGNOSIS — R945 Abnormal results of liver function studies: Secondary | ICD-10-CM | POA: Diagnosis not present

## 2020-06-25 DIAGNOSIS — J449 Chronic obstructive pulmonary disease, unspecified: Secondary | ICD-10-CM | POA: Diagnosis not present

## 2020-06-25 DIAGNOSIS — J208 Acute bronchitis due to other specified organisms: Secondary | ICD-10-CM | POA: Diagnosis not present

## 2020-06-25 DIAGNOSIS — N318 Other neuromuscular dysfunction of bladder: Secondary | ICD-10-CM | POA: Diagnosis not present

## 2020-06-25 DIAGNOSIS — M159 Polyosteoarthritis, unspecified: Secondary | ICD-10-CM | POA: Diagnosis not present

## 2020-06-25 DIAGNOSIS — K219 Gastro-esophageal reflux disease without esophagitis: Secondary | ICD-10-CM | POA: Diagnosis not present

## 2020-06-25 DIAGNOSIS — R600 Localized edema: Secondary | ICD-10-CM | POA: Diagnosis not present

## 2020-06-25 DIAGNOSIS — B9689 Other specified bacterial agents as the cause of diseases classified elsewhere: Secondary | ICD-10-CM | POA: Diagnosis not present

## 2020-06-25 DIAGNOSIS — L930 Discoid lupus erythematosus: Secondary | ICD-10-CM | POA: Diagnosis not present

## 2020-06-25 DIAGNOSIS — F5101 Primary insomnia: Secondary | ICD-10-CM | POA: Diagnosis not present

## 2020-06-25 DIAGNOSIS — F411 Generalized anxiety disorder: Secondary | ICD-10-CM | POA: Diagnosis not present

## 2020-07-09 DIAGNOSIS — K219 Gastro-esophageal reflux disease without esophagitis: Secondary | ICD-10-CM | POA: Diagnosis not present

## 2020-07-09 DIAGNOSIS — J208 Acute bronchitis due to other specified organisms: Secondary | ICD-10-CM | POA: Diagnosis not present

## 2020-07-09 DIAGNOSIS — M159 Polyosteoarthritis, unspecified: Secondary | ICD-10-CM | POA: Diagnosis not present

## 2020-07-09 DIAGNOSIS — F5101 Primary insomnia: Secondary | ICD-10-CM | POA: Diagnosis not present

## 2020-07-09 DIAGNOSIS — R945 Abnormal results of liver function studies: Secondary | ICD-10-CM | POA: Diagnosis not present

## 2020-07-09 DIAGNOSIS — F411 Generalized anxiety disorder: Secondary | ICD-10-CM | POA: Diagnosis not present

## 2020-07-09 DIAGNOSIS — R600 Localized edema: Secondary | ICD-10-CM | POA: Diagnosis not present

## 2020-07-09 DIAGNOSIS — L930 Discoid lupus erythematosus: Secondary | ICD-10-CM | POA: Diagnosis not present

## 2020-07-09 DIAGNOSIS — K589 Irritable bowel syndrome without diarrhea: Secondary | ICD-10-CM | POA: Diagnosis not present

## 2020-07-09 DIAGNOSIS — N318 Other neuromuscular dysfunction of bladder: Secondary | ICD-10-CM | POA: Diagnosis not present

## 2020-07-09 DIAGNOSIS — J449 Chronic obstructive pulmonary disease, unspecified: Secondary | ICD-10-CM | POA: Diagnosis not present

## 2020-07-09 DIAGNOSIS — B9689 Other specified bacterial agents as the cause of diseases classified elsewhere: Secondary | ICD-10-CM | POA: Diagnosis not present

## 2020-08-06 DIAGNOSIS — R600 Localized edema: Secondary | ICD-10-CM | POA: Diagnosis not present

## 2020-08-06 DIAGNOSIS — M159 Polyosteoarthritis, unspecified: Secondary | ICD-10-CM | POA: Diagnosis not present

## 2020-08-06 DIAGNOSIS — R945 Abnormal results of liver function studies: Secondary | ICD-10-CM | POA: Diagnosis not present

## 2020-08-06 DIAGNOSIS — K219 Gastro-esophageal reflux disease without esophagitis: Secondary | ICD-10-CM | POA: Diagnosis not present

## 2020-08-06 DIAGNOSIS — R110 Nausea: Secondary | ICD-10-CM | POA: Diagnosis not present

## 2020-08-06 DIAGNOSIS — N318 Other neuromuscular dysfunction of bladder: Secondary | ICD-10-CM | POA: Diagnosis not present

## 2020-08-06 DIAGNOSIS — K589 Irritable bowel syndrome without diarrhea: Secondary | ICD-10-CM | POA: Diagnosis not present

## 2020-08-06 DIAGNOSIS — I700 Atherosclerosis of aorta: Secondary | ICD-10-CM | POA: Diagnosis not present

## 2020-08-06 DIAGNOSIS — F5101 Primary insomnia: Secondary | ICD-10-CM | POA: Diagnosis not present

## 2020-08-06 DIAGNOSIS — L930 Discoid lupus erythematosus: Secondary | ICD-10-CM | POA: Diagnosis not present

## 2020-08-06 DIAGNOSIS — J449 Chronic obstructive pulmonary disease, unspecified: Secondary | ICD-10-CM | POA: Diagnosis not present

## 2020-08-06 DIAGNOSIS — F411 Generalized anxiety disorder: Secondary | ICD-10-CM | POA: Diagnosis not present

## 2020-08-11 DIAGNOSIS — Z1331 Encounter for screening for depression: Secondary | ICD-10-CM | POA: Diagnosis not present

## 2020-08-11 DIAGNOSIS — Z9181 History of falling: Secondary | ICD-10-CM | POA: Diagnosis not present

## 2020-08-11 DIAGNOSIS — Z0000 Encounter for general adult medical examination without abnormal findings: Secondary | ICD-10-CM | POA: Diagnosis not present

## 2020-09-03 DIAGNOSIS — F411 Generalized anxiety disorder: Secondary | ICD-10-CM | POA: Diagnosis not present

## 2020-09-03 DIAGNOSIS — I700 Atherosclerosis of aorta: Secondary | ICD-10-CM | POA: Diagnosis not present

## 2020-09-03 DIAGNOSIS — K219 Gastro-esophageal reflux disease without esophagitis: Secondary | ICD-10-CM | POA: Diagnosis not present

## 2020-09-03 DIAGNOSIS — L930 Discoid lupus erythematosus: Secondary | ICD-10-CM | POA: Diagnosis not present

## 2020-09-03 DIAGNOSIS — K589 Irritable bowel syndrome without diarrhea: Secondary | ICD-10-CM | POA: Diagnosis not present

## 2020-09-03 DIAGNOSIS — N318 Other neuromuscular dysfunction of bladder: Secondary | ICD-10-CM | POA: Diagnosis not present

## 2020-09-03 DIAGNOSIS — M159 Polyosteoarthritis, unspecified: Secondary | ICD-10-CM | POA: Diagnosis not present

## 2020-09-03 DIAGNOSIS — R110 Nausea: Secondary | ICD-10-CM | POA: Diagnosis not present

## 2020-09-03 DIAGNOSIS — J449 Chronic obstructive pulmonary disease, unspecified: Secondary | ICD-10-CM | POA: Diagnosis not present

## 2020-09-03 DIAGNOSIS — R600 Localized edema: Secondary | ICD-10-CM | POA: Diagnosis not present

## 2020-09-03 DIAGNOSIS — F5101 Primary insomnia: Secondary | ICD-10-CM | POA: Diagnosis not present

## 2020-09-03 DIAGNOSIS — R945 Abnormal results of liver function studies: Secondary | ICD-10-CM | POA: Diagnosis not present

## 2020-10-01 DIAGNOSIS — M159 Polyosteoarthritis, unspecified: Secondary | ICD-10-CM | POA: Diagnosis not present

## 2020-10-01 DIAGNOSIS — R600 Localized edema: Secondary | ICD-10-CM | POA: Diagnosis not present

## 2020-10-01 DIAGNOSIS — B373 Candidiasis of vulva and vagina: Secondary | ICD-10-CM | POA: Diagnosis not present

## 2020-10-01 DIAGNOSIS — R945 Abnormal results of liver function studies: Secondary | ICD-10-CM | POA: Diagnosis not present

## 2020-10-01 DIAGNOSIS — L930 Discoid lupus erythematosus: Secondary | ICD-10-CM | POA: Diagnosis not present

## 2020-10-01 DIAGNOSIS — I700 Atherosclerosis of aorta: Secondary | ICD-10-CM | POA: Diagnosis not present

## 2020-10-01 DIAGNOSIS — N318 Other neuromuscular dysfunction of bladder: Secondary | ICD-10-CM | POA: Diagnosis not present

## 2020-10-01 DIAGNOSIS — J449 Chronic obstructive pulmonary disease, unspecified: Secondary | ICD-10-CM | POA: Diagnosis not present

## 2020-10-01 DIAGNOSIS — F411 Generalized anxiety disorder: Secondary | ICD-10-CM | POA: Diagnosis not present

## 2020-10-01 DIAGNOSIS — K589 Irritable bowel syndrome without diarrhea: Secondary | ICD-10-CM | POA: Diagnosis not present

## 2020-10-01 DIAGNOSIS — K219 Gastro-esophageal reflux disease without esophagitis: Secondary | ICD-10-CM | POA: Diagnosis not present

## 2020-10-01 DIAGNOSIS — F5101 Primary insomnia: Secondary | ICD-10-CM | POA: Diagnosis not present

## 2020-10-29 DIAGNOSIS — R945 Abnormal results of liver function studies: Secondary | ICD-10-CM | POA: Diagnosis not present

## 2020-10-29 DIAGNOSIS — K219 Gastro-esophageal reflux disease without esophagitis: Secondary | ICD-10-CM | POA: Diagnosis not present

## 2020-10-29 DIAGNOSIS — J208 Acute bronchitis due to other specified organisms: Secondary | ICD-10-CM | POA: Diagnosis not present

## 2020-10-29 DIAGNOSIS — F411 Generalized anxiety disorder: Secondary | ICD-10-CM | POA: Diagnosis not present

## 2020-10-29 DIAGNOSIS — F5101 Primary insomnia: Secondary | ICD-10-CM | POA: Diagnosis not present

## 2020-10-29 DIAGNOSIS — R600 Localized edema: Secondary | ICD-10-CM | POA: Diagnosis not present

## 2020-10-29 DIAGNOSIS — L930 Discoid lupus erythematosus: Secondary | ICD-10-CM | POA: Diagnosis not present

## 2020-10-29 DIAGNOSIS — J449 Chronic obstructive pulmonary disease, unspecified: Secondary | ICD-10-CM | POA: Diagnosis not present

## 2020-10-29 DIAGNOSIS — B9689 Other specified bacterial agents as the cause of diseases classified elsewhere: Secondary | ICD-10-CM | POA: Diagnosis not present

## 2020-10-29 DIAGNOSIS — N318 Other neuromuscular dysfunction of bladder: Secondary | ICD-10-CM | POA: Diagnosis not present

## 2020-10-29 DIAGNOSIS — M159 Polyosteoarthritis, unspecified: Secondary | ICD-10-CM | POA: Diagnosis not present

## 2020-10-29 DIAGNOSIS — K589 Irritable bowel syndrome without diarrhea: Secondary | ICD-10-CM | POA: Diagnosis not present

## 2020-11-12 DIAGNOSIS — K589 Irritable bowel syndrome without diarrhea: Secondary | ICD-10-CM | POA: Diagnosis not present

## 2020-11-12 DIAGNOSIS — R600 Localized edema: Secondary | ICD-10-CM | POA: Diagnosis not present

## 2020-11-12 DIAGNOSIS — K219 Gastro-esophageal reflux disease without esophagitis: Secondary | ICD-10-CM | POA: Diagnosis not present

## 2020-11-12 DIAGNOSIS — N318 Other neuromuscular dysfunction of bladder: Secondary | ICD-10-CM | POA: Diagnosis not present

## 2020-11-12 DIAGNOSIS — B9689 Other specified bacterial agents as the cause of diseases classified elsewhere: Secondary | ICD-10-CM | POA: Diagnosis not present

## 2020-11-12 DIAGNOSIS — J449 Chronic obstructive pulmonary disease, unspecified: Secondary | ICD-10-CM | POA: Diagnosis not present

## 2020-11-12 DIAGNOSIS — L930 Discoid lupus erythematosus: Secondary | ICD-10-CM | POA: Diagnosis not present

## 2020-11-12 DIAGNOSIS — I700 Atherosclerosis of aorta: Secondary | ICD-10-CM | POA: Diagnosis not present

## 2020-11-12 DIAGNOSIS — F411 Generalized anxiety disorder: Secondary | ICD-10-CM | POA: Diagnosis not present

## 2020-11-12 DIAGNOSIS — J208 Acute bronchitis due to other specified organisms: Secondary | ICD-10-CM | POA: Diagnosis not present

## 2020-11-12 DIAGNOSIS — R110 Nausea: Secondary | ICD-10-CM | POA: Diagnosis not present

## 2020-11-12 DIAGNOSIS — R945 Abnormal results of liver function studies: Secondary | ICD-10-CM | POA: Diagnosis not present

## 2020-11-26 DIAGNOSIS — N318 Other neuromuscular dysfunction of bladder: Secondary | ICD-10-CM | POA: Diagnosis not present

## 2020-11-26 DIAGNOSIS — R945 Abnormal results of liver function studies: Secondary | ICD-10-CM | POA: Diagnosis not present

## 2020-11-26 DIAGNOSIS — L930 Discoid lupus erythematosus: Secondary | ICD-10-CM | POA: Diagnosis not present

## 2020-11-26 DIAGNOSIS — K589 Irritable bowel syndrome without diarrhea: Secondary | ICD-10-CM | POA: Diagnosis not present

## 2020-11-26 DIAGNOSIS — R600 Localized edema: Secondary | ICD-10-CM | POA: Diagnosis not present

## 2020-11-26 DIAGNOSIS — R110 Nausea: Secondary | ICD-10-CM | POA: Diagnosis not present

## 2020-11-26 DIAGNOSIS — I700 Atherosclerosis of aorta: Secondary | ICD-10-CM | POA: Diagnosis not present

## 2020-11-26 DIAGNOSIS — M159 Polyosteoarthritis, unspecified: Secondary | ICD-10-CM | POA: Diagnosis not present

## 2020-11-26 DIAGNOSIS — J449 Chronic obstructive pulmonary disease, unspecified: Secondary | ICD-10-CM | POA: Diagnosis not present

## 2020-11-26 DIAGNOSIS — F411 Generalized anxiety disorder: Secondary | ICD-10-CM | POA: Diagnosis not present

## 2020-11-26 DIAGNOSIS — K219 Gastro-esophageal reflux disease without esophagitis: Secondary | ICD-10-CM | POA: Diagnosis not present

## 2020-11-26 DIAGNOSIS — F5101 Primary insomnia: Secondary | ICD-10-CM | POA: Diagnosis not present

## 2020-12-24 DIAGNOSIS — I700 Atherosclerosis of aorta: Secondary | ICD-10-CM | POA: Diagnosis not present

## 2020-12-24 DIAGNOSIS — K219 Gastro-esophageal reflux disease without esophagitis: Secondary | ICD-10-CM | POA: Diagnosis not present

## 2020-12-24 DIAGNOSIS — R110 Nausea: Secondary | ICD-10-CM | POA: Diagnosis not present

## 2020-12-24 DIAGNOSIS — F5101 Primary insomnia: Secondary | ICD-10-CM | POA: Diagnosis not present

## 2020-12-24 DIAGNOSIS — M159 Polyosteoarthritis, unspecified: Secondary | ICD-10-CM | POA: Diagnosis not present

## 2020-12-24 DIAGNOSIS — K589 Irritable bowel syndrome without diarrhea: Secondary | ICD-10-CM | POA: Diagnosis not present

## 2020-12-24 DIAGNOSIS — R600 Localized edema: Secondary | ICD-10-CM | POA: Diagnosis not present

## 2020-12-24 DIAGNOSIS — L930 Discoid lupus erythematosus: Secondary | ICD-10-CM | POA: Diagnosis not present

## 2020-12-24 DIAGNOSIS — J449 Chronic obstructive pulmonary disease, unspecified: Secondary | ICD-10-CM | POA: Diagnosis not present

## 2020-12-24 DIAGNOSIS — R945 Abnormal results of liver function studies: Secondary | ICD-10-CM | POA: Diagnosis not present

## 2020-12-24 DIAGNOSIS — N318 Other neuromuscular dysfunction of bladder: Secondary | ICD-10-CM | POA: Diagnosis not present

## 2020-12-24 DIAGNOSIS — F411 Generalized anxiety disorder: Secondary | ICD-10-CM | POA: Diagnosis not present

## 2021-01-21 DIAGNOSIS — R600 Localized edema: Secondary | ICD-10-CM | POA: Diagnosis not present

## 2021-01-21 DIAGNOSIS — K219 Gastro-esophageal reflux disease without esophagitis: Secondary | ICD-10-CM | POA: Diagnosis not present

## 2021-01-21 DIAGNOSIS — N318 Other neuromuscular dysfunction of bladder: Secondary | ICD-10-CM | POA: Diagnosis not present

## 2021-01-21 DIAGNOSIS — R945 Abnormal results of liver function studies: Secondary | ICD-10-CM | POA: Diagnosis not present

## 2021-01-21 DIAGNOSIS — J449 Chronic obstructive pulmonary disease, unspecified: Secondary | ICD-10-CM | POA: Diagnosis not present

## 2021-01-21 DIAGNOSIS — F411 Generalized anxiety disorder: Secondary | ICD-10-CM | POA: Diagnosis not present

## 2021-01-21 DIAGNOSIS — K589 Irritable bowel syndrome without diarrhea: Secondary | ICD-10-CM | POA: Diagnosis not present

## 2021-01-21 DIAGNOSIS — L930 Discoid lupus erythematosus: Secondary | ICD-10-CM | POA: Diagnosis not present

## 2021-01-21 DIAGNOSIS — M159 Polyosteoarthritis, unspecified: Secondary | ICD-10-CM | POA: Diagnosis not present

## 2021-01-21 DIAGNOSIS — I700 Atherosclerosis of aorta: Secondary | ICD-10-CM | POA: Diagnosis not present

## 2021-01-21 DIAGNOSIS — F5101 Primary insomnia: Secondary | ICD-10-CM | POA: Diagnosis not present

## 2021-01-21 DIAGNOSIS — R110 Nausea: Secondary | ICD-10-CM | POA: Diagnosis not present

## 2021-02-18 DIAGNOSIS — B373 Candidiasis of vulva and vagina: Secondary | ICD-10-CM | POA: Diagnosis not present

## 2021-02-18 DIAGNOSIS — R600 Localized edema: Secondary | ICD-10-CM | POA: Diagnosis not present

## 2021-02-18 DIAGNOSIS — L930 Discoid lupus erythematosus: Secondary | ICD-10-CM | POA: Diagnosis not present

## 2021-02-18 DIAGNOSIS — K219 Gastro-esophageal reflux disease without esophagitis: Secondary | ICD-10-CM | POA: Diagnosis not present

## 2021-02-18 DIAGNOSIS — I700 Atherosclerosis of aorta: Secondary | ICD-10-CM | POA: Diagnosis not present

## 2021-02-18 DIAGNOSIS — N318 Other neuromuscular dysfunction of bladder: Secondary | ICD-10-CM | POA: Diagnosis not present

## 2021-02-18 DIAGNOSIS — F5101 Primary insomnia: Secondary | ICD-10-CM | POA: Diagnosis not present

## 2021-02-18 DIAGNOSIS — M159 Polyosteoarthritis, unspecified: Secondary | ICD-10-CM | POA: Diagnosis not present

## 2021-02-18 DIAGNOSIS — K589 Irritable bowel syndrome without diarrhea: Secondary | ICD-10-CM | POA: Diagnosis not present

## 2021-02-18 DIAGNOSIS — R945 Abnormal results of liver function studies: Secondary | ICD-10-CM | POA: Diagnosis not present

## 2021-02-18 DIAGNOSIS — F411 Generalized anxiety disorder: Secondary | ICD-10-CM | POA: Diagnosis not present

## 2021-02-18 DIAGNOSIS — R110 Nausea: Secondary | ICD-10-CM | POA: Diagnosis not present

## 2021-03-18 DIAGNOSIS — M159 Polyosteoarthritis, unspecified: Secondary | ICD-10-CM | POA: Diagnosis not present

## 2021-03-18 DIAGNOSIS — N318 Other neuromuscular dysfunction of bladder: Secondary | ICD-10-CM | POA: Diagnosis not present

## 2021-03-18 DIAGNOSIS — K589 Irritable bowel syndrome without diarrhea: Secondary | ICD-10-CM | POA: Diagnosis not present

## 2021-03-18 DIAGNOSIS — J449 Chronic obstructive pulmonary disease, unspecified: Secondary | ICD-10-CM | POA: Diagnosis not present

## 2021-03-18 DIAGNOSIS — R110 Nausea: Secondary | ICD-10-CM | POA: Diagnosis not present

## 2021-03-18 DIAGNOSIS — F5101 Primary insomnia: Secondary | ICD-10-CM | POA: Diagnosis not present

## 2021-03-18 DIAGNOSIS — K219 Gastro-esophageal reflux disease without esophagitis: Secondary | ICD-10-CM | POA: Diagnosis not present

## 2021-03-18 DIAGNOSIS — R600 Localized edema: Secondary | ICD-10-CM | POA: Diagnosis not present

## 2021-03-18 DIAGNOSIS — L930 Discoid lupus erythematosus: Secondary | ICD-10-CM | POA: Diagnosis not present

## 2021-03-18 DIAGNOSIS — I700 Atherosclerosis of aorta: Secondary | ICD-10-CM | POA: Diagnosis not present

## 2021-03-18 DIAGNOSIS — F411 Generalized anxiety disorder: Secondary | ICD-10-CM | POA: Diagnosis not present

## 2021-03-18 DIAGNOSIS — R945 Abnormal results of liver function studies: Secondary | ICD-10-CM | POA: Diagnosis not present

## 2021-03-25 DIAGNOSIS — K589 Irritable bowel syndrome without diarrhea: Secondary | ICD-10-CM | POA: Diagnosis not present

## 2021-03-25 DIAGNOSIS — M159 Polyosteoarthritis, unspecified: Secondary | ICD-10-CM | POA: Diagnosis not present

## 2021-03-25 DIAGNOSIS — J449 Chronic obstructive pulmonary disease, unspecified: Secondary | ICD-10-CM | POA: Diagnosis not present

## 2021-03-25 DIAGNOSIS — K219 Gastro-esophageal reflux disease without esophagitis: Secondary | ICD-10-CM | POA: Diagnosis not present

## 2021-03-25 DIAGNOSIS — J208 Acute bronchitis due to other specified organisms: Secondary | ICD-10-CM | POA: Diagnosis not present

## 2021-03-25 DIAGNOSIS — N318 Other neuromuscular dysfunction of bladder: Secondary | ICD-10-CM | POA: Diagnosis not present

## 2021-03-25 DIAGNOSIS — R945 Abnormal results of liver function studies: Secondary | ICD-10-CM | POA: Diagnosis not present

## 2021-03-25 DIAGNOSIS — L930 Discoid lupus erythematosus: Secondary | ICD-10-CM | POA: Diagnosis not present

## 2021-03-25 DIAGNOSIS — F5101 Primary insomnia: Secondary | ICD-10-CM | POA: Diagnosis not present

## 2021-03-25 DIAGNOSIS — F411 Generalized anxiety disorder: Secondary | ICD-10-CM | POA: Diagnosis not present

## 2021-03-25 DIAGNOSIS — R600 Localized edema: Secondary | ICD-10-CM | POA: Diagnosis not present

## 2021-03-25 DIAGNOSIS — B9689 Other specified bacterial agents as the cause of diseases classified elsewhere: Secondary | ICD-10-CM | POA: Diagnosis not present

## 2021-04-15 DIAGNOSIS — K219 Gastro-esophageal reflux disease without esophagitis: Secondary | ICD-10-CM | POA: Diagnosis not present

## 2021-04-15 DIAGNOSIS — R600 Localized edema: Secondary | ICD-10-CM | POA: Diagnosis not present

## 2021-04-15 DIAGNOSIS — M159 Polyosteoarthritis, unspecified: Secondary | ICD-10-CM | POA: Diagnosis not present

## 2021-04-15 DIAGNOSIS — F411 Generalized anxiety disorder: Secondary | ICD-10-CM | POA: Diagnosis not present

## 2021-04-15 DIAGNOSIS — F5101 Primary insomnia: Secondary | ICD-10-CM | POA: Diagnosis not present

## 2021-04-15 DIAGNOSIS — N318 Other neuromuscular dysfunction of bladder: Secondary | ICD-10-CM | POA: Diagnosis not present

## 2021-04-15 DIAGNOSIS — B9689 Other specified bacterial agents as the cause of diseases classified elsewhere: Secondary | ICD-10-CM | POA: Diagnosis not present

## 2021-04-15 DIAGNOSIS — K589 Irritable bowel syndrome without diarrhea: Secondary | ICD-10-CM | POA: Diagnosis not present

## 2021-04-15 DIAGNOSIS — L930 Discoid lupus erythematosus: Secondary | ICD-10-CM | POA: Diagnosis not present

## 2021-04-15 DIAGNOSIS — R945 Abnormal results of liver function studies: Secondary | ICD-10-CM | POA: Diagnosis not present

## 2021-04-15 DIAGNOSIS — J449 Chronic obstructive pulmonary disease, unspecified: Secondary | ICD-10-CM | POA: Diagnosis not present

## 2021-04-15 DIAGNOSIS — J208 Acute bronchitis due to other specified organisms: Secondary | ICD-10-CM | POA: Diagnosis not present

## 2021-04-25 DIAGNOSIS — F5101 Primary insomnia: Secondary | ICD-10-CM | POA: Diagnosis not present

## 2021-04-25 DIAGNOSIS — J449 Chronic obstructive pulmonary disease, unspecified: Secondary | ICD-10-CM | POA: Diagnosis not present

## 2021-04-25 DIAGNOSIS — R110 Nausea: Secondary | ICD-10-CM | POA: Diagnosis not present

## 2021-04-25 DIAGNOSIS — K219 Gastro-esophageal reflux disease without esophagitis: Secondary | ICD-10-CM | POA: Diagnosis not present

## 2021-04-25 DIAGNOSIS — L930 Discoid lupus erythematosus: Secondary | ICD-10-CM | POA: Diagnosis not present

## 2021-04-25 DIAGNOSIS — M159 Polyosteoarthritis, unspecified: Secondary | ICD-10-CM | POA: Diagnosis not present

## 2021-04-25 DIAGNOSIS — R600 Localized edema: Secondary | ICD-10-CM | POA: Diagnosis not present

## 2021-04-25 DIAGNOSIS — N318 Other neuromuscular dysfunction of bladder: Secondary | ICD-10-CM | POA: Diagnosis not present

## 2021-04-25 DIAGNOSIS — F411 Generalized anxiety disorder: Secondary | ICD-10-CM | POA: Diagnosis not present

## 2021-04-25 DIAGNOSIS — I700 Atherosclerosis of aorta: Secondary | ICD-10-CM | POA: Diagnosis not present

## 2021-04-25 DIAGNOSIS — R945 Abnormal results of liver function studies: Secondary | ICD-10-CM | POA: Diagnosis not present

## 2021-04-25 DIAGNOSIS — K589 Irritable bowel syndrome without diarrhea: Secondary | ICD-10-CM | POA: Diagnosis not present

## 2021-05-09 DIAGNOSIS — R634 Abnormal weight loss: Secondary | ICD-10-CM | POA: Diagnosis not present

## 2021-05-09 DIAGNOSIS — K219 Gastro-esophageal reflux disease without esophagitis: Secondary | ICD-10-CM | POA: Diagnosis not present

## 2021-05-09 DIAGNOSIS — K589 Irritable bowel syndrome without diarrhea: Secondary | ICD-10-CM | POA: Diagnosis not present

## 2021-05-09 DIAGNOSIS — G609 Hereditary and idiopathic neuropathy, unspecified: Secondary | ICD-10-CM | POA: Diagnosis not present

## 2021-05-09 DIAGNOSIS — N318 Other neuromuscular dysfunction of bladder: Secondary | ICD-10-CM | POA: Diagnosis not present

## 2021-05-09 DIAGNOSIS — M4804 Spinal stenosis, thoracic region: Secondary | ICD-10-CM | POA: Diagnosis not present

## 2021-05-09 DIAGNOSIS — L930 Discoid lupus erythematosus: Secondary | ICD-10-CM | POA: Diagnosis not present

## 2021-05-09 DIAGNOSIS — R918 Other nonspecific abnormal finding of lung field: Secondary | ICD-10-CM | POA: Diagnosis not present

## 2021-05-09 DIAGNOSIS — R600 Localized edema: Secondary | ICD-10-CM | POA: Diagnosis not present

## 2021-05-09 DIAGNOSIS — R945 Abnormal results of liver function studies: Secondary | ICD-10-CM | POA: Diagnosis not present

## 2021-05-09 DIAGNOSIS — F411 Generalized anxiety disorder: Secondary | ICD-10-CM | POA: Diagnosis not present

## 2021-05-09 DIAGNOSIS — I700 Atherosclerosis of aorta: Secondary | ICD-10-CM | POA: Diagnosis not present

## 2021-05-09 DIAGNOSIS — M5134 Other intervertebral disc degeneration, thoracic region: Secondary | ICD-10-CM | POA: Diagnosis not present

## 2021-05-09 DIAGNOSIS — J449 Chronic obstructive pulmonary disease, unspecified: Secondary | ICD-10-CM | POA: Diagnosis not present

## 2021-05-09 DIAGNOSIS — R110 Nausea: Secondary | ICD-10-CM | POA: Diagnosis not present

## 2021-05-09 DIAGNOSIS — M549 Dorsalgia, unspecified: Secondary | ICD-10-CM | POA: Diagnosis not present

## 2021-05-09 DIAGNOSIS — M546 Pain in thoracic spine: Secondary | ICD-10-CM | POA: Diagnosis not present

## 2021-05-13 DIAGNOSIS — R110 Nausea: Secondary | ICD-10-CM | POA: Diagnosis not present

## 2021-05-13 DIAGNOSIS — N318 Other neuromuscular dysfunction of bladder: Secondary | ICD-10-CM | POA: Diagnosis not present

## 2021-05-13 DIAGNOSIS — F5101 Primary insomnia: Secondary | ICD-10-CM | POA: Diagnosis not present

## 2021-05-13 DIAGNOSIS — I700 Atherosclerosis of aorta: Secondary | ICD-10-CM | POA: Diagnosis not present

## 2021-05-13 DIAGNOSIS — R945 Abnormal results of liver function studies: Secondary | ICD-10-CM | POA: Diagnosis not present

## 2021-05-13 DIAGNOSIS — K219 Gastro-esophageal reflux disease without esophagitis: Secondary | ICD-10-CM | POA: Diagnosis not present

## 2021-05-13 DIAGNOSIS — J449 Chronic obstructive pulmonary disease, unspecified: Secondary | ICD-10-CM | POA: Diagnosis not present

## 2021-05-13 DIAGNOSIS — F411 Generalized anxiety disorder: Secondary | ICD-10-CM | POA: Diagnosis not present

## 2021-05-13 DIAGNOSIS — M159 Polyosteoarthritis, unspecified: Secondary | ICD-10-CM | POA: Diagnosis not present

## 2021-05-13 DIAGNOSIS — L930 Discoid lupus erythematosus: Secondary | ICD-10-CM | POA: Diagnosis not present

## 2021-05-13 DIAGNOSIS — K589 Irritable bowel syndrome without diarrhea: Secondary | ICD-10-CM | POA: Diagnosis not present

## 2021-05-13 DIAGNOSIS — R600 Localized edema: Secondary | ICD-10-CM | POA: Diagnosis not present

## 2021-05-25 DIAGNOSIS — J449 Chronic obstructive pulmonary disease, unspecified: Secondary | ICD-10-CM | POA: Diagnosis not present

## 2021-05-25 DIAGNOSIS — M159 Polyosteoarthritis, unspecified: Secondary | ICD-10-CM | POA: Diagnosis not present

## 2021-06-10 DIAGNOSIS — R600 Localized edema: Secondary | ICD-10-CM | POA: Diagnosis not present

## 2021-06-10 DIAGNOSIS — K589 Irritable bowel syndrome without diarrhea: Secondary | ICD-10-CM | POA: Diagnosis not present

## 2021-06-10 DIAGNOSIS — I700 Atherosclerosis of aorta: Secondary | ICD-10-CM | POA: Diagnosis not present

## 2021-06-10 DIAGNOSIS — L930 Discoid lupus erythematosus: Secondary | ICD-10-CM | POA: Diagnosis not present

## 2021-06-10 DIAGNOSIS — K219 Gastro-esophageal reflux disease without esophagitis: Secondary | ICD-10-CM | POA: Diagnosis not present

## 2021-06-10 DIAGNOSIS — N318 Other neuromuscular dysfunction of bladder: Secondary | ICD-10-CM | POA: Diagnosis not present

## 2021-06-10 DIAGNOSIS — F411 Generalized anxiety disorder: Secondary | ICD-10-CM | POA: Diagnosis not present

## 2021-06-10 DIAGNOSIS — M159 Polyosteoarthritis, unspecified: Secondary | ICD-10-CM | POA: Diagnosis not present

## 2021-06-10 DIAGNOSIS — R945 Abnormal results of liver function studies: Secondary | ICD-10-CM | POA: Diagnosis not present

## 2021-06-10 DIAGNOSIS — R110 Nausea: Secondary | ICD-10-CM | POA: Diagnosis not present

## 2021-06-10 DIAGNOSIS — J449 Chronic obstructive pulmonary disease, unspecified: Secondary | ICD-10-CM | POA: Diagnosis not present

## 2021-06-10 DIAGNOSIS — F5101 Primary insomnia: Secondary | ICD-10-CM | POA: Diagnosis not present

## 2021-06-20 DIAGNOSIS — M159 Polyosteoarthritis, unspecified: Secondary | ICD-10-CM | POA: Diagnosis not present

## 2021-06-20 DIAGNOSIS — J449 Chronic obstructive pulmonary disease, unspecified: Secondary | ICD-10-CM | POA: Diagnosis not present

## 2021-07-01 DIAGNOSIS — R110 Nausea: Secondary | ICD-10-CM | POA: Diagnosis not present

## 2021-07-01 DIAGNOSIS — N318 Other neuromuscular dysfunction of bladder: Secondary | ICD-10-CM | POA: Diagnosis not present

## 2021-07-01 DIAGNOSIS — K589 Irritable bowel syndrome without diarrhea: Secondary | ICD-10-CM | POA: Diagnosis not present

## 2021-07-01 DIAGNOSIS — F411 Generalized anxiety disorder: Secondary | ICD-10-CM | POA: Diagnosis not present

## 2021-07-01 DIAGNOSIS — G609 Hereditary and idiopathic neuropathy, unspecified: Secondary | ICD-10-CM | POA: Diagnosis not present

## 2021-07-01 DIAGNOSIS — I700 Atherosclerosis of aorta: Secondary | ICD-10-CM | POA: Diagnosis not present

## 2021-07-01 DIAGNOSIS — J449 Chronic obstructive pulmonary disease, unspecified: Secondary | ICD-10-CM | POA: Diagnosis not present

## 2021-07-01 DIAGNOSIS — K219 Gastro-esophageal reflux disease without esophagitis: Secondary | ICD-10-CM | POA: Diagnosis not present

## 2021-07-01 DIAGNOSIS — L930 Discoid lupus erythematosus: Secondary | ICD-10-CM | POA: Diagnosis not present

## 2021-07-01 DIAGNOSIS — R945 Abnormal results of liver function studies: Secondary | ICD-10-CM | POA: Diagnosis not present

## 2021-07-01 DIAGNOSIS — R918 Other nonspecific abnormal finding of lung field: Secondary | ICD-10-CM | POA: Diagnosis not present

## 2021-07-01 DIAGNOSIS — R600 Localized edema: Secondary | ICD-10-CM | POA: Diagnosis not present

## 2021-07-08 DIAGNOSIS — R110 Nausea: Secondary | ICD-10-CM | POA: Diagnosis not present

## 2021-07-08 DIAGNOSIS — I700 Atherosclerosis of aorta: Secondary | ICD-10-CM | POA: Diagnosis not present

## 2021-07-08 DIAGNOSIS — L930 Discoid lupus erythematosus: Secondary | ICD-10-CM | POA: Diagnosis not present

## 2021-07-08 DIAGNOSIS — F411 Generalized anxiety disorder: Secondary | ICD-10-CM | POA: Diagnosis not present

## 2021-07-08 DIAGNOSIS — K589 Irritable bowel syndrome without diarrhea: Secondary | ICD-10-CM | POA: Diagnosis not present

## 2021-07-08 DIAGNOSIS — R600 Localized edema: Secondary | ICD-10-CM | POA: Diagnosis not present

## 2021-07-08 DIAGNOSIS — F5101 Primary insomnia: Secondary | ICD-10-CM | POA: Diagnosis not present

## 2021-07-08 DIAGNOSIS — M159 Polyosteoarthritis, unspecified: Secondary | ICD-10-CM | POA: Diagnosis not present

## 2021-07-08 DIAGNOSIS — R945 Abnormal results of liver function studies: Secondary | ICD-10-CM | POA: Diagnosis not present

## 2021-07-08 DIAGNOSIS — J449 Chronic obstructive pulmonary disease, unspecified: Secondary | ICD-10-CM | POA: Diagnosis not present

## 2021-07-08 DIAGNOSIS — K219 Gastro-esophageal reflux disease without esophagitis: Secondary | ICD-10-CM | POA: Diagnosis not present

## 2021-07-08 DIAGNOSIS — N318 Other neuromuscular dysfunction of bladder: Secondary | ICD-10-CM | POA: Diagnosis not present

## 2021-07-18 DIAGNOSIS — M159 Polyosteoarthritis, unspecified: Secondary | ICD-10-CM | POA: Diagnosis not present

## 2021-07-18 DIAGNOSIS — J449 Chronic obstructive pulmonary disease, unspecified: Secondary | ICD-10-CM | POA: Diagnosis not present

## 2021-07-22 DIAGNOSIS — N318 Other neuromuscular dysfunction of bladder: Secondary | ICD-10-CM | POA: Diagnosis not present

## 2021-07-22 DIAGNOSIS — J449 Chronic obstructive pulmonary disease, unspecified: Secondary | ICD-10-CM | POA: Diagnosis not present

## 2021-07-22 DIAGNOSIS — L930 Discoid lupus erythematosus: Secondary | ICD-10-CM | POA: Diagnosis not present

## 2021-07-22 DIAGNOSIS — R110 Nausea: Secondary | ICD-10-CM | POA: Diagnosis not present

## 2021-07-22 DIAGNOSIS — K589 Irritable bowel syndrome without diarrhea: Secondary | ICD-10-CM | POA: Diagnosis not present

## 2021-07-22 DIAGNOSIS — R600 Localized edema: Secondary | ICD-10-CM | POA: Diagnosis not present

## 2021-07-22 DIAGNOSIS — G609 Hereditary and idiopathic neuropathy, unspecified: Secondary | ICD-10-CM | POA: Diagnosis not present

## 2021-07-22 DIAGNOSIS — I700 Atherosclerosis of aorta: Secondary | ICD-10-CM | POA: Diagnosis not present

## 2021-07-22 DIAGNOSIS — R945 Abnormal results of liver function studies: Secondary | ICD-10-CM | POA: Diagnosis not present

## 2021-07-22 DIAGNOSIS — F411 Generalized anxiety disorder: Secondary | ICD-10-CM | POA: Diagnosis not present

## 2021-07-22 DIAGNOSIS — N390 Urinary tract infection, site not specified: Secondary | ICD-10-CM | POA: Diagnosis not present

## 2021-07-22 DIAGNOSIS — K219 Gastro-esophageal reflux disease without esophagitis: Secondary | ICD-10-CM | POA: Diagnosis not present

## 2021-08-05 DIAGNOSIS — M159 Polyosteoarthritis, unspecified: Secondary | ICD-10-CM | POA: Diagnosis not present

## 2021-08-05 DIAGNOSIS — K219 Gastro-esophageal reflux disease without esophagitis: Secondary | ICD-10-CM | POA: Diagnosis not present

## 2021-08-05 DIAGNOSIS — N318 Other neuromuscular dysfunction of bladder: Secondary | ICD-10-CM | POA: Diagnosis not present

## 2021-08-05 DIAGNOSIS — J208 Acute bronchitis due to other specified organisms: Secondary | ICD-10-CM | POA: Diagnosis not present

## 2021-08-05 DIAGNOSIS — K589 Irritable bowel syndrome without diarrhea: Secondary | ICD-10-CM | POA: Diagnosis not present

## 2021-08-05 DIAGNOSIS — R600 Localized edema: Secondary | ICD-10-CM | POA: Diagnosis not present

## 2021-08-05 DIAGNOSIS — L930 Discoid lupus erythematosus: Secondary | ICD-10-CM | POA: Diagnosis not present

## 2021-08-05 DIAGNOSIS — F5101 Primary insomnia: Secondary | ICD-10-CM | POA: Diagnosis not present

## 2021-08-05 DIAGNOSIS — I700 Atherosclerosis of aorta: Secondary | ICD-10-CM | POA: Diagnosis not present

## 2021-08-05 DIAGNOSIS — R945 Abnormal results of liver function studies: Secondary | ICD-10-CM | POA: Diagnosis not present

## 2021-08-05 DIAGNOSIS — R110 Nausea: Secondary | ICD-10-CM | POA: Diagnosis not present

## 2021-08-05 DIAGNOSIS — B9689 Other specified bacterial agents as the cause of diseases classified elsewhere: Secondary | ICD-10-CM | POA: Diagnosis not present

## 2021-09-02 DIAGNOSIS — I700 Atherosclerosis of aorta: Secondary | ICD-10-CM | POA: Diagnosis not present

## 2021-09-02 DIAGNOSIS — F5101 Primary insomnia: Secondary | ICD-10-CM | POA: Diagnosis not present

## 2021-09-02 DIAGNOSIS — L930 Discoid lupus erythematosus: Secondary | ICD-10-CM | POA: Diagnosis not present

## 2021-09-02 DIAGNOSIS — N318 Other neuromuscular dysfunction of bladder: Secondary | ICD-10-CM | POA: Diagnosis not present

## 2021-09-02 DIAGNOSIS — G609 Hereditary and idiopathic neuropathy, unspecified: Secondary | ICD-10-CM | POA: Diagnosis not present

## 2021-09-02 DIAGNOSIS — K219 Gastro-esophageal reflux disease without esophagitis: Secondary | ICD-10-CM | POA: Diagnosis not present

## 2021-09-02 DIAGNOSIS — R945 Abnormal results of liver function studies: Secondary | ICD-10-CM | POA: Diagnosis not present

## 2021-09-02 DIAGNOSIS — M159 Polyosteoarthritis, unspecified: Secondary | ICD-10-CM | POA: Diagnosis not present

## 2021-09-02 DIAGNOSIS — K589 Irritable bowel syndrome without diarrhea: Secondary | ICD-10-CM | POA: Diagnosis not present

## 2021-09-02 DIAGNOSIS — R110 Nausea: Secondary | ICD-10-CM | POA: Diagnosis not present

## 2021-09-02 DIAGNOSIS — R600 Localized edema: Secondary | ICD-10-CM | POA: Diagnosis not present

## 2021-09-02 DIAGNOSIS — J208 Acute bronchitis due to other specified organisms: Secondary | ICD-10-CM | POA: Diagnosis not present

## 2021-09-23 DIAGNOSIS — K219 Gastro-esophageal reflux disease without esophagitis: Secondary | ICD-10-CM | POA: Diagnosis not present

## 2021-09-23 DIAGNOSIS — J449 Chronic obstructive pulmonary disease, unspecified: Secondary | ICD-10-CM | POA: Diagnosis not present

## 2021-09-30 DIAGNOSIS — N318 Other neuromuscular dysfunction of bladder: Secondary | ICD-10-CM | POA: Diagnosis not present

## 2021-09-30 DIAGNOSIS — R600 Localized edema: Secondary | ICD-10-CM | POA: Diagnosis not present

## 2021-09-30 DIAGNOSIS — I700 Atherosclerosis of aorta: Secondary | ICD-10-CM | POA: Diagnosis not present

## 2021-09-30 DIAGNOSIS — R945 Abnormal results of liver function studies: Secondary | ICD-10-CM | POA: Diagnosis not present

## 2021-09-30 DIAGNOSIS — K219 Gastro-esophageal reflux disease without esophagitis: Secondary | ICD-10-CM | POA: Diagnosis not present

## 2021-09-30 DIAGNOSIS — J449 Chronic obstructive pulmonary disease, unspecified: Secondary | ICD-10-CM | POA: Diagnosis not present

## 2021-09-30 DIAGNOSIS — F411 Generalized anxiety disorder: Secondary | ICD-10-CM | POA: Diagnosis not present

## 2021-09-30 DIAGNOSIS — F5101 Primary insomnia: Secondary | ICD-10-CM | POA: Diagnosis not present

## 2021-09-30 DIAGNOSIS — K589 Irritable bowel syndrome without diarrhea: Secondary | ICD-10-CM | POA: Diagnosis not present

## 2021-09-30 DIAGNOSIS — M159 Polyosteoarthritis, unspecified: Secondary | ICD-10-CM | POA: Diagnosis not present

## 2021-09-30 DIAGNOSIS — L930 Discoid lupus erythematosus: Secondary | ICD-10-CM | POA: Diagnosis not present

## 2021-09-30 DIAGNOSIS — R110 Nausea: Secondary | ICD-10-CM | POA: Diagnosis not present

## 2021-10-25 DIAGNOSIS — K219 Gastro-esophageal reflux disease without esophagitis: Secondary | ICD-10-CM | POA: Diagnosis not present

## 2021-10-25 DIAGNOSIS — J449 Chronic obstructive pulmonary disease, unspecified: Secondary | ICD-10-CM | POA: Diagnosis not present

## 2021-10-27 DIAGNOSIS — I700 Atherosclerosis of aorta: Secondary | ICD-10-CM | POA: Diagnosis not present

## 2021-10-27 DIAGNOSIS — L930 Discoid lupus erythematosus: Secondary | ICD-10-CM | POA: Diagnosis not present

## 2021-10-27 DIAGNOSIS — R600 Localized edema: Secondary | ICD-10-CM | POA: Diagnosis not present

## 2021-10-27 DIAGNOSIS — K219 Gastro-esophageal reflux disease without esophagitis: Secondary | ICD-10-CM | POA: Diagnosis not present

## 2021-10-27 DIAGNOSIS — J208 Acute bronchitis due to other specified organisms: Secondary | ICD-10-CM | POA: Diagnosis not present

## 2021-10-27 DIAGNOSIS — F5101 Primary insomnia: Secondary | ICD-10-CM | POA: Diagnosis not present

## 2021-10-27 DIAGNOSIS — B9689 Other specified bacterial agents as the cause of diseases classified elsewhere: Secondary | ICD-10-CM | POA: Diagnosis not present

## 2021-10-27 DIAGNOSIS — R945 Abnormal results of liver function studies: Secondary | ICD-10-CM | POA: Diagnosis not present

## 2021-10-27 DIAGNOSIS — R110 Nausea: Secondary | ICD-10-CM | POA: Diagnosis not present

## 2021-10-27 DIAGNOSIS — M159 Polyosteoarthritis, unspecified: Secondary | ICD-10-CM | POA: Diagnosis not present

## 2021-10-27 DIAGNOSIS — N318 Other neuromuscular dysfunction of bladder: Secondary | ICD-10-CM | POA: Diagnosis not present

## 2021-10-27 DIAGNOSIS — K589 Irritable bowel syndrome without diarrhea: Secondary | ICD-10-CM | POA: Diagnosis not present

## 2021-11-22 DIAGNOSIS — M159 Polyosteoarthritis, unspecified: Secondary | ICD-10-CM | POA: Diagnosis not present

## 2021-11-22 DIAGNOSIS — J449 Chronic obstructive pulmonary disease, unspecified: Secondary | ICD-10-CM | POA: Diagnosis not present

## 2021-11-24 DIAGNOSIS — K219 Gastro-esophageal reflux disease without esophagitis: Secondary | ICD-10-CM | POA: Diagnosis not present

## 2021-11-24 DIAGNOSIS — L930 Discoid lupus erythematosus: Secondary | ICD-10-CM | POA: Diagnosis not present

## 2021-11-24 DIAGNOSIS — F5101 Primary insomnia: Secondary | ICD-10-CM | POA: Diagnosis not present

## 2021-11-24 DIAGNOSIS — I208 Other forms of angina pectoris: Secondary | ICD-10-CM | POA: Diagnosis not present

## 2021-11-24 DIAGNOSIS — K589 Irritable bowel syndrome without diarrhea: Secondary | ICD-10-CM | POA: Diagnosis not present

## 2021-11-24 DIAGNOSIS — N318 Other neuromuscular dysfunction of bladder: Secondary | ICD-10-CM | POA: Diagnosis not present

## 2021-11-24 DIAGNOSIS — R945 Abnormal results of liver function studies: Secondary | ICD-10-CM | POA: Diagnosis not present

## 2021-11-24 DIAGNOSIS — J0100 Acute maxillary sinusitis, unspecified: Secondary | ICD-10-CM | POA: Diagnosis not present

## 2021-11-24 DIAGNOSIS — J449 Chronic obstructive pulmonary disease, unspecified: Secondary | ICD-10-CM | POA: Diagnosis not present

## 2021-11-24 DIAGNOSIS — F411 Generalized anxiety disorder: Secondary | ICD-10-CM | POA: Diagnosis not present

## 2021-11-24 DIAGNOSIS — R600 Localized edema: Secondary | ICD-10-CM | POA: Diagnosis not present

## 2021-11-24 DIAGNOSIS — M159 Polyosteoarthritis, unspecified: Secondary | ICD-10-CM | POA: Diagnosis not present

## 2021-12-22 DIAGNOSIS — I700 Atherosclerosis of aorta: Secondary | ICD-10-CM | POA: Diagnosis not present

## 2021-12-22 DIAGNOSIS — L930 Discoid lupus erythematosus: Secondary | ICD-10-CM | POA: Diagnosis not present

## 2021-12-22 DIAGNOSIS — K219 Gastro-esophageal reflux disease without esophagitis: Secondary | ICD-10-CM | POA: Diagnosis not present

## 2021-12-22 DIAGNOSIS — F5101 Primary insomnia: Secondary | ICD-10-CM | POA: Diagnosis not present

## 2021-12-22 DIAGNOSIS — K589 Irritable bowel syndrome without diarrhea: Secondary | ICD-10-CM | POA: Diagnosis not present

## 2021-12-22 DIAGNOSIS — R600 Localized edema: Secondary | ICD-10-CM | POA: Diagnosis not present

## 2021-12-22 DIAGNOSIS — F411 Generalized anxiety disorder: Secondary | ICD-10-CM | POA: Diagnosis not present

## 2021-12-22 DIAGNOSIS — N318 Other neuromuscular dysfunction of bladder: Secondary | ICD-10-CM | POA: Diagnosis not present

## 2021-12-22 DIAGNOSIS — J449 Chronic obstructive pulmonary disease, unspecified: Secondary | ICD-10-CM | POA: Diagnosis not present

## 2021-12-22 DIAGNOSIS — I208 Other forms of angina pectoris: Secondary | ICD-10-CM | POA: Diagnosis not present

## 2021-12-22 DIAGNOSIS — R945 Abnormal results of liver function studies: Secondary | ICD-10-CM | POA: Diagnosis not present

## 2021-12-22 DIAGNOSIS — M159 Polyosteoarthritis, unspecified: Secondary | ICD-10-CM | POA: Diagnosis not present

## 2022-01-19 DIAGNOSIS — F5101 Primary insomnia: Secondary | ICD-10-CM | POA: Diagnosis not present

## 2022-01-19 DIAGNOSIS — F411 Generalized anxiety disorder: Secondary | ICD-10-CM | POA: Diagnosis not present

## 2022-01-19 DIAGNOSIS — J449 Chronic obstructive pulmonary disease, unspecified: Secondary | ICD-10-CM | POA: Diagnosis not present

## 2022-01-19 DIAGNOSIS — I208 Other forms of angina pectoris: Secondary | ICD-10-CM | POA: Diagnosis not present

## 2022-01-19 DIAGNOSIS — N318 Other neuromuscular dysfunction of bladder: Secondary | ICD-10-CM | POA: Diagnosis not present

## 2022-01-19 DIAGNOSIS — H1033 Unspecified acute conjunctivitis, bilateral: Secondary | ICD-10-CM | POA: Diagnosis not present

## 2022-01-19 DIAGNOSIS — K219 Gastro-esophageal reflux disease without esophagitis: Secondary | ICD-10-CM | POA: Diagnosis not present

## 2022-01-19 DIAGNOSIS — R945 Abnormal results of liver function studies: Secondary | ICD-10-CM | POA: Diagnosis not present

## 2022-01-19 DIAGNOSIS — R600 Localized edema: Secondary | ICD-10-CM | POA: Diagnosis not present

## 2022-01-19 DIAGNOSIS — L930 Discoid lupus erythematosus: Secondary | ICD-10-CM | POA: Diagnosis not present

## 2022-01-19 DIAGNOSIS — K589 Irritable bowel syndrome without diarrhea: Secondary | ICD-10-CM | POA: Diagnosis not present

## 2022-01-19 DIAGNOSIS — M159 Polyosteoarthritis, unspecified: Secondary | ICD-10-CM | POA: Diagnosis not present

## 2022-02-16 DIAGNOSIS — K219 Gastro-esophageal reflux disease without esophagitis: Secondary | ICD-10-CM | POA: Diagnosis not present

## 2022-02-16 DIAGNOSIS — L930 Discoid lupus erythematosus: Secondary | ICD-10-CM | POA: Diagnosis not present

## 2022-02-16 DIAGNOSIS — I208 Other forms of angina pectoris: Secondary | ICD-10-CM | POA: Diagnosis not present

## 2022-02-16 DIAGNOSIS — F411 Generalized anxiety disorder: Secondary | ICD-10-CM | POA: Diagnosis not present

## 2022-02-16 DIAGNOSIS — N318 Other neuromuscular dysfunction of bladder: Secondary | ICD-10-CM | POA: Diagnosis not present

## 2022-02-16 DIAGNOSIS — M159 Polyosteoarthritis, unspecified: Secondary | ICD-10-CM | POA: Diagnosis not present

## 2022-02-16 DIAGNOSIS — R600 Localized edema: Secondary | ICD-10-CM | POA: Diagnosis not present

## 2022-02-16 DIAGNOSIS — F5101 Primary insomnia: Secondary | ICD-10-CM | POA: Diagnosis not present

## 2022-02-16 DIAGNOSIS — R945 Abnormal results of liver function studies: Secondary | ICD-10-CM | POA: Diagnosis not present

## 2022-02-16 DIAGNOSIS — J449 Chronic obstructive pulmonary disease, unspecified: Secondary | ICD-10-CM | POA: Diagnosis not present

## 2022-02-16 DIAGNOSIS — K589 Irritable bowel syndrome without diarrhea: Secondary | ICD-10-CM | POA: Diagnosis not present

## 2022-02-16 DIAGNOSIS — H1033 Unspecified acute conjunctivitis, bilateral: Secondary | ICD-10-CM | POA: Diagnosis not present

## 2022-03-16 DIAGNOSIS — K219 Gastro-esophageal reflux disease without esophagitis: Secondary | ICD-10-CM | POA: Diagnosis not present

## 2022-03-16 DIAGNOSIS — J449 Chronic obstructive pulmonary disease, unspecified: Secondary | ICD-10-CM | POA: Diagnosis not present

## 2022-03-16 DIAGNOSIS — N318 Other neuromuscular dysfunction of bladder: Secondary | ICD-10-CM | POA: Diagnosis not present

## 2022-03-16 DIAGNOSIS — F5101 Primary insomnia: Secondary | ICD-10-CM | POA: Diagnosis not present

## 2022-03-16 DIAGNOSIS — J069 Acute upper respiratory infection, unspecified: Secondary | ICD-10-CM | POA: Diagnosis not present

## 2022-03-16 DIAGNOSIS — I208 Other forms of angina pectoris: Secondary | ICD-10-CM | POA: Diagnosis not present

## 2022-03-16 DIAGNOSIS — R600 Localized edema: Secondary | ICD-10-CM | POA: Diagnosis not present

## 2022-03-16 DIAGNOSIS — L930 Discoid lupus erythematosus: Secondary | ICD-10-CM | POA: Diagnosis not present

## 2022-03-16 DIAGNOSIS — F411 Generalized anxiety disorder: Secondary | ICD-10-CM | POA: Diagnosis not present

## 2022-03-16 DIAGNOSIS — H1033 Unspecified acute conjunctivitis, bilateral: Secondary | ICD-10-CM | POA: Diagnosis not present

## 2022-03-16 DIAGNOSIS — K589 Irritable bowel syndrome without diarrhea: Secondary | ICD-10-CM | POA: Diagnosis not present

## 2022-03-16 DIAGNOSIS — M159 Polyosteoarthritis, unspecified: Secondary | ICD-10-CM | POA: Diagnosis not present

## 2022-04-13 DIAGNOSIS — M159 Polyosteoarthritis, unspecified: Secondary | ICD-10-CM | POA: Diagnosis not present

## 2022-04-13 DIAGNOSIS — K219 Gastro-esophageal reflux disease without esophagitis: Secondary | ICD-10-CM | POA: Diagnosis not present

## 2022-04-13 DIAGNOSIS — R600 Localized edema: Secondary | ICD-10-CM | POA: Diagnosis not present

## 2022-04-13 DIAGNOSIS — R945 Abnormal results of liver function studies: Secondary | ICD-10-CM | POA: Diagnosis not present

## 2022-04-13 DIAGNOSIS — N318 Other neuromuscular dysfunction of bladder: Secondary | ICD-10-CM | POA: Diagnosis not present

## 2022-04-13 DIAGNOSIS — I208 Other forms of angina pectoris: Secondary | ICD-10-CM | POA: Diagnosis not present

## 2022-04-13 DIAGNOSIS — K589 Irritable bowel syndrome without diarrhea: Secondary | ICD-10-CM | POA: Diagnosis not present

## 2022-04-13 DIAGNOSIS — J449 Chronic obstructive pulmonary disease, unspecified: Secondary | ICD-10-CM | POA: Diagnosis not present

## 2022-04-13 DIAGNOSIS — J069 Acute upper respiratory infection, unspecified: Secondary | ICD-10-CM | POA: Diagnosis not present

## 2022-04-13 DIAGNOSIS — F5101 Primary insomnia: Secondary | ICD-10-CM | POA: Diagnosis not present

## 2022-04-13 DIAGNOSIS — F411 Generalized anxiety disorder: Secondary | ICD-10-CM | POA: Diagnosis not present

## 2022-04-13 DIAGNOSIS — L930 Discoid lupus erythematosus: Secondary | ICD-10-CM | POA: Diagnosis not present

## 2022-05-04 DIAGNOSIS — N318 Other neuromuscular dysfunction of bladder: Secondary | ICD-10-CM | POA: Diagnosis not present

## 2022-05-04 DIAGNOSIS — K589 Irritable bowel syndrome without diarrhea: Secondary | ICD-10-CM | POA: Diagnosis not present

## 2022-05-04 DIAGNOSIS — F5101 Primary insomnia: Secondary | ICD-10-CM | POA: Diagnosis not present

## 2022-05-04 DIAGNOSIS — K219 Gastro-esophageal reflux disease without esophagitis: Secondary | ICD-10-CM | POA: Diagnosis not present

## 2022-05-04 DIAGNOSIS — J069 Acute upper respiratory infection, unspecified: Secondary | ICD-10-CM | POA: Diagnosis not present

## 2022-05-04 DIAGNOSIS — M159 Polyosteoarthritis, unspecified: Secondary | ICD-10-CM | POA: Diagnosis not present

## 2022-05-04 DIAGNOSIS — J449 Chronic obstructive pulmonary disease, unspecified: Secondary | ICD-10-CM | POA: Diagnosis not present

## 2022-05-04 DIAGNOSIS — L930 Discoid lupus erythematosus: Secondary | ICD-10-CM | POA: Diagnosis not present

## 2022-05-04 DIAGNOSIS — F411 Generalized anxiety disorder: Secondary | ICD-10-CM | POA: Diagnosis not present

## 2022-05-04 DIAGNOSIS — R600 Localized edema: Secondary | ICD-10-CM | POA: Diagnosis not present

## 2022-05-04 DIAGNOSIS — I208 Other forms of angina pectoris: Secondary | ICD-10-CM | POA: Diagnosis not present

## 2022-05-04 DIAGNOSIS — Z681 Body mass index (BMI) 19 or less, adult: Secondary | ICD-10-CM | POA: Diagnosis not present

## 2022-05-30 DIAGNOSIS — I208 Other forms of angina pectoris: Secondary | ICD-10-CM | POA: Diagnosis not present

## 2022-05-30 DIAGNOSIS — R945 Abnormal results of liver function studies: Secondary | ICD-10-CM | POA: Diagnosis not present

## 2022-05-30 DIAGNOSIS — L309 Dermatitis, unspecified: Secondary | ICD-10-CM | POA: Diagnosis not present

## 2022-05-30 DIAGNOSIS — J449 Chronic obstructive pulmonary disease, unspecified: Secondary | ICD-10-CM | POA: Diagnosis not present

## 2022-05-30 DIAGNOSIS — K589 Irritable bowel syndrome without diarrhea: Secondary | ICD-10-CM | POA: Diagnosis not present

## 2022-05-30 DIAGNOSIS — R600 Localized edema: Secondary | ICD-10-CM | POA: Diagnosis not present

## 2022-05-30 DIAGNOSIS — N318 Other neuromuscular dysfunction of bladder: Secondary | ICD-10-CM | POA: Diagnosis not present

## 2022-05-30 DIAGNOSIS — F411 Generalized anxiety disorder: Secondary | ICD-10-CM | POA: Diagnosis not present

## 2022-05-30 DIAGNOSIS — R5382 Chronic fatigue, unspecified: Secondary | ICD-10-CM | POA: Diagnosis not present

## 2022-05-30 DIAGNOSIS — H1033 Unspecified acute conjunctivitis, bilateral: Secondary | ICD-10-CM | POA: Diagnosis not present

## 2022-05-30 DIAGNOSIS — F5101 Primary insomnia: Secondary | ICD-10-CM | POA: Diagnosis not present

## 2022-05-30 DIAGNOSIS — L0390 Cellulitis, unspecified: Secondary | ICD-10-CM | POA: Diagnosis not present

## 2022-05-30 DIAGNOSIS — M159 Polyosteoarthritis, unspecified: Secondary | ICD-10-CM | POA: Diagnosis not present

## 2022-06-13 DIAGNOSIS — L309 Dermatitis, unspecified: Secondary | ICD-10-CM | POA: Diagnosis not present

## 2022-06-13 DIAGNOSIS — F5101 Primary insomnia: Secondary | ICD-10-CM | POA: Diagnosis not present

## 2022-06-13 DIAGNOSIS — M159 Polyosteoarthritis, unspecified: Secondary | ICD-10-CM | POA: Diagnosis not present

## 2022-06-13 DIAGNOSIS — L0390 Cellulitis, unspecified: Secondary | ICD-10-CM | POA: Diagnosis not present

## 2022-06-13 DIAGNOSIS — K589 Irritable bowel syndrome without diarrhea: Secondary | ICD-10-CM | POA: Diagnosis not present

## 2022-06-13 DIAGNOSIS — N318 Other neuromuscular dysfunction of bladder: Secondary | ICD-10-CM | POA: Diagnosis not present

## 2022-06-13 DIAGNOSIS — L930 Discoid lupus erythematosus: Secondary | ICD-10-CM | POA: Diagnosis not present

## 2022-06-13 DIAGNOSIS — J449 Chronic obstructive pulmonary disease, unspecified: Secondary | ICD-10-CM | POA: Diagnosis not present

## 2022-06-13 DIAGNOSIS — H1033 Unspecified acute conjunctivitis, bilateral: Secondary | ICD-10-CM | POA: Diagnosis not present

## 2022-06-13 DIAGNOSIS — F411 Generalized anxiety disorder: Secondary | ICD-10-CM | POA: Diagnosis not present

## 2022-06-13 DIAGNOSIS — R600 Localized edema: Secondary | ICD-10-CM | POA: Diagnosis not present

## 2022-06-13 DIAGNOSIS — I208 Other forms of angina pectoris: Secondary | ICD-10-CM | POA: Diagnosis not present

## 2022-06-27 DIAGNOSIS — K589 Irritable bowel syndrome without diarrhea: Secondary | ICD-10-CM | POA: Diagnosis not present

## 2022-06-27 DIAGNOSIS — F5101 Primary insomnia: Secondary | ICD-10-CM | POA: Diagnosis not present

## 2022-06-27 DIAGNOSIS — N318 Other neuromuscular dysfunction of bladder: Secondary | ICD-10-CM | POA: Diagnosis not present

## 2022-06-27 DIAGNOSIS — K219 Gastro-esophageal reflux disease without esophagitis: Secondary | ICD-10-CM | POA: Diagnosis not present

## 2022-06-27 DIAGNOSIS — L930 Discoid lupus erythematosus: Secondary | ICD-10-CM | POA: Diagnosis not present

## 2022-06-27 DIAGNOSIS — G2581 Restless legs syndrome: Secondary | ICD-10-CM | POA: Diagnosis not present

## 2022-06-27 DIAGNOSIS — I10 Essential (primary) hypertension: Secondary | ICD-10-CM | POA: Diagnosis not present

## 2022-06-27 DIAGNOSIS — M159 Polyosteoarthritis, unspecified: Secondary | ICD-10-CM | POA: Diagnosis not present

## 2022-06-27 DIAGNOSIS — R600 Localized edema: Secondary | ICD-10-CM | POA: Diagnosis not present

## 2022-06-27 DIAGNOSIS — F411 Generalized anxiety disorder: Secondary | ICD-10-CM | POA: Diagnosis not present

## 2022-06-27 DIAGNOSIS — J449 Chronic obstructive pulmonary disease, unspecified: Secondary | ICD-10-CM | POA: Diagnosis not present

## 2022-06-27 DIAGNOSIS — L309 Dermatitis, unspecified: Secondary | ICD-10-CM | POA: Diagnosis not present

## 2022-07-11 DIAGNOSIS — J449 Chronic obstructive pulmonary disease, unspecified: Secondary | ICD-10-CM | POA: Diagnosis not present

## 2022-07-11 DIAGNOSIS — L309 Dermatitis, unspecified: Secondary | ICD-10-CM | POA: Diagnosis not present

## 2022-07-11 DIAGNOSIS — F5101 Primary insomnia: Secondary | ICD-10-CM | POA: Diagnosis not present

## 2022-07-11 DIAGNOSIS — M159 Polyosteoarthritis, unspecified: Secondary | ICD-10-CM | POA: Diagnosis not present

## 2022-07-11 DIAGNOSIS — F411 Generalized anxiety disorder: Secondary | ICD-10-CM | POA: Diagnosis not present

## 2022-07-11 DIAGNOSIS — G2581 Restless legs syndrome: Secondary | ICD-10-CM | POA: Diagnosis not present

## 2022-07-11 DIAGNOSIS — I10 Essential (primary) hypertension: Secondary | ICD-10-CM | POA: Diagnosis not present

## 2022-07-11 DIAGNOSIS — R600 Localized edema: Secondary | ICD-10-CM | POA: Diagnosis not present

## 2022-07-11 DIAGNOSIS — R197 Diarrhea, unspecified: Secondary | ICD-10-CM | POA: Diagnosis not present

## 2022-07-11 DIAGNOSIS — N318 Other neuromuscular dysfunction of bladder: Secondary | ICD-10-CM | POA: Diagnosis not present

## 2022-07-11 DIAGNOSIS — L930 Discoid lupus erythematosus: Secondary | ICD-10-CM | POA: Diagnosis not present

## 2022-07-11 DIAGNOSIS — K589 Irritable bowel syndrome without diarrhea: Secondary | ICD-10-CM | POA: Diagnosis not present

## 2022-07-25 DIAGNOSIS — F411 Generalized anxiety disorder: Secondary | ICD-10-CM | POA: Diagnosis not present

## 2022-07-25 DIAGNOSIS — R197 Diarrhea, unspecified: Secondary | ICD-10-CM | POA: Diagnosis not present

## 2022-07-25 DIAGNOSIS — K589 Irritable bowel syndrome without diarrhea: Secondary | ICD-10-CM | POA: Diagnosis not present

## 2022-07-25 DIAGNOSIS — J208 Acute bronchitis due to other specified organisms: Secondary | ICD-10-CM | POA: Diagnosis not present

## 2022-07-25 DIAGNOSIS — M159 Polyosteoarthritis, unspecified: Secondary | ICD-10-CM | POA: Diagnosis not present

## 2022-07-25 DIAGNOSIS — G2581 Restless legs syndrome: Secondary | ICD-10-CM | POA: Diagnosis not present

## 2022-07-25 DIAGNOSIS — L309 Dermatitis, unspecified: Secondary | ICD-10-CM | POA: Diagnosis not present

## 2022-07-25 DIAGNOSIS — N318 Other neuromuscular dysfunction of bladder: Secondary | ICD-10-CM | POA: Diagnosis not present

## 2022-07-25 DIAGNOSIS — J449 Chronic obstructive pulmonary disease, unspecified: Secondary | ICD-10-CM | POA: Diagnosis not present

## 2022-07-25 DIAGNOSIS — F5101 Primary insomnia: Secondary | ICD-10-CM | POA: Diagnosis not present

## 2022-07-25 DIAGNOSIS — B9689 Other specified bacterial agents as the cause of diseases classified elsewhere: Secondary | ICD-10-CM | POA: Diagnosis not present

## 2022-08-08 DIAGNOSIS — K219 Gastro-esophageal reflux disease without esophagitis: Secondary | ICD-10-CM | POA: Diagnosis not present

## 2022-08-08 DIAGNOSIS — F5101 Primary insomnia: Secondary | ICD-10-CM | POA: Diagnosis not present

## 2022-08-08 DIAGNOSIS — R197 Diarrhea, unspecified: Secondary | ICD-10-CM | POA: Diagnosis not present

## 2022-08-08 DIAGNOSIS — R600 Localized edema: Secondary | ICD-10-CM | POA: Diagnosis not present

## 2022-08-08 DIAGNOSIS — J449 Chronic obstructive pulmonary disease, unspecified: Secondary | ICD-10-CM | POA: Diagnosis not present

## 2022-08-08 DIAGNOSIS — M159 Polyosteoarthritis, unspecified: Secondary | ICD-10-CM | POA: Diagnosis not present

## 2022-08-08 DIAGNOSIS — L930 Discoid lupus erythematosus: Secondary | ICD-10-CM | POA: Diagnosis not present

## 2022-08-08 DIAGNOSIS — F411 Generalized anxiety disorder: Secondary | ICD-10-CM | POA: Diagnosis not present

## 2022-08-08 DIAGNOSIS — G2581 Restless legs syndrome: Secondary | ICD-10-CM | POA: Diagnosis not present

## 2022-08-08 DIAGNOSIS — N318 Other neuromuscular dysfunction of bladder: Secondary | ICD-10-CM | POA: Diagnosis not present

## 2022-08-08 DIAGNOSIS — K589 Irritable bowel syndrome without diarrhea: Secondary | ICD-10-CM | POA: Diagnosis not present

## 2022-08-08 DIAGNOSIS — L309 Dermatitis, unspecified: Secondary | ICD-10-CM | POA: Diagnosis not present

## 2022-08-22 DIAGNOSIS — L930 Discoid lupus erythematosus: Secondary | ICD-10-CM | POA: Diagnosis not present

## 2022-08-22 DIAGNOSIS — F411 Generalized anxiety disorder: Secondary | ICD-10-CM | POA: Diagnosis not present

## 2022-08-22 DIAGNOSIS — F5101 Primary insomnia: Secondary | ICD-10-CM | POA: Diagnosis not present

## 2022-08-22 DIAGNOSIS — L309 Dermatitis, unspecified: Secondary | ICD-10-CM | POA: Diagnosis not present

## 2022-08-22 DIAGNOSIS — G2581 Restless legs syndrome: Secondary | ICD-10-CM | POA: Diagnosis not present

## 2022-08-22 DIAGNOSIS — N318 Other neuromuscular dysfunction of bladder: Secondary | ICD-10-CM | POA: Diagnosis not present

## 2022-08-22 DIAGNOSIS — K589 Irritable bowel syndrome without diarrhea: Secondary | ICD-10-CM | POA: Diagnosis not present

## 2022-08-22 DIAGNOSIS — R197 Diarrhea, unspecified: Secondary | ICD-10-CM | POA: Diagnosis not present

## 2022-08-22 DIAGNOSIS — J449 Chronic obstructive pulmonary disease, unspecified: Secondary | ICD-10-CM | POA: Diagnosis not present

## 2022-08-22 DIAGNOSIS — M159 Polyosteoarthritis, unspecified: Secondary | ICD-10-CM | POA: Diagnosis not present

## 2022-08-22 DIAGNOSIS — R600 Localized edema: Secondary | ICD-10-CM | POA: Diagnosis not present

## 2022-08-22 DIAGNOSIS — K219 Gastro-esophageal reflux disease without esophagitis: Secondary | ICD-10-CM | POA: Diagnosis not present

## 2022-09-05 DIAGNOSIS — R600 Localized edema: Secondary | ICD-10-CM | POA: Diagnosis not present

## 2022-09-05 DIAGNOSIS — R197 Diarrhea, unspecified: Secondary | ICD-10-CM | POA: Diagnosis not present

## 2022-09-05 DIAGNOSIS — F5101 Primary insomnia: Secondary | ICD-10-CM | POA: Diagnosis not present

## 2022-09-05 DIAGNOSIS — N318 Other neuromuscular dysfunction of bladder: Secondary | ICD-10-CM | POA: Diagnosis not present

## 2022-09-05 DIAGNOSIS — L309 Dermatitis, unspecified: Secondary | ICD-10-CM | POA: Diagnosis not present

## 2022-09-05 DIAGNOSIS — K589 Irritable bowel syndrome without diarrhea: Secondary | ICD-10-CM | POA: Diagnosis not present

## 2022-09-05 DIAGNOSIS — J449 Chronic obstructive pulmonary disease, unspecified: Secondary | ICD-10-CM | POA: Diagnosis not present

## 2022-09-05 DIAGNOSIS — L930 Discoid lupus erythematosus: Secondary | ICD-10-CM | POA: Diagnosis not present

## 2022-09-05 DIAGNOSIS — G2581 Restless legs syndrome: Secondary | ICD-10-CM | POA: Diagnosis not present

## 2022-09-05 DIAGNOSIS — M159 Polyosteoarthritis, unspecified: Secondary | ICD-10-CM | POA: Diagnosis not present

## 2022-09-05 DIAGNOSIS — F411 Generalized anxiety disorder: Secondary | ICD-10-CM | POA: Diagnosis not present

## 2022-09-05 DIAGNOSIS — K219 Gastro-esophageal reflux disease without esophagitis: Secondary | ICD-10-CM | POA: Diagnosis not present

## 2022-10-03 DIAGNOSIS — G2581 Restless legs syndrome: Secondary | ICD-10-CM | POA: Diagnosis not present

## 2022-10-03 DIAGNOSIS — F3341 Major depressive disorder, recurrent, in partial remission: Secondary | ICD-10-CM | POA: Diagnosis not present

## 2022-10-03 DIAGNOSIS — F5101 Primary insomnia: Secondary | ICD-10-CM | POA: Diagnosis not present

## 2022-10-03 DIAGNOSIS — M159 Polyosteoarthritis, unspecified: Secondary | ICD-10-CM | POA: Diagnosis not present

## 2022-10-03 DIAGNOSIS — K589 Irritable bowel syndrome without diarrhea: Secondary | ICD-10-CM | POA: Diagnosis not present

## 2022-10-03 DIAGNOSIS — L309 Dermatitis, unspecified: Secondary | ICD-10-CM | POA: Diagnosis not present

## 2022-10-03 DIAGNOSIS — L930 Discoid lupus erythematosus: Secondary | ICD-10-CM | POA: Diagnosis not present

## 2022-10-03 DIAGNOSIS — K219 Gastro-esophageal reflux disease without esophagitis: Secondary | ICD-10-CM | POA: Diagnosis not present

## 2022-10-03 DIAGNOSIS — J449 Chronic obstructive pulmonary disease, unspecified: Secondary | ICD-10-CM | POA: Diagnosis not present

## 2022-10-03 DIAGNOSIS — F411 Generalized anxiety disorder: Secondary | ICD-10-CM | POA: Diagnosis not present

## 2022-10-03 DIAGNOSIS — R600 Localized edema: Secondary | ICD-10-CM | POA: Diagnosis not present

## 2022-10-03 DIAGNOSIS — N318 Other neuromuscular dysfunction of bladder: Secondary | ICD-10-CM | POA: Diagnosis not present

## 2022-10-04 DIAGNOSIS — M65812 Other synovitis and tenosynovitis, left shoulder: Secondary | ICD-10-CM | POA: Diagnosis not present

## 2022-10-04 DIAGNOSIS — F5101 Primary insomnia: Secondary | ICD-10-CM | POA: Diagnosis not present

## 2022-10-04 DIAGNOSIS — F411 Generalized anxiety disorder: Secondary | ICD-10-CM | POA: Diagnosis not present

## 2022-10-04 DIAGNOSIS — M159 Polyosteoarthritis, unspecified: Secondary | ICD-10-CM | POA: Diagnosis not present

## 2022-10-04 DIAGNOSIS — J069 Acute upper respiratory infection, unspecified: Secondary | ICD-10-CM | POA: Diagnosis not present

## 2022-10-04 DIAGNOSIS — K589 Irritable bowel syndrome without diarrhea: Secondary | ICD-10-CM | POA: Diagnosis not present

## 2022-10-04 DIAGNOSIS — L309 Dermatitis, unspecified: Secondary | ICD-10-CM | POA: Diagnosis not present

## 2022-10-04 DIAGNOSIS — R600 Localized edema: Secondary | ICD-10-CM | POA: Diagnosis not present

## 2022-10-04 DIAGNOSIS — N318 Other neuromuscular dysfunction of bladder: Secondary | ICD-10-CM | POA: Diagnosis not present

## 2022-10-04 DIAGNOSIS — G2581 Restless legs syndrome: Secondary | ICD-10-CM | POA: Diagnosis not present

## 2022-10-04 DIAGNOSIS — J449 Chronic obstructive pulmonary disease, unspecified: Secondary | ICD-10-CM | POA: Diagnosis not present

## 2022-10-04 DIAGNOSIS — F3341 Major depressive disorder, recurrent, in partial remission: Secondary | ICD-10-CM | POA: Diagnosis not present

## 2022-10-09 DIAGNOSIS — M65812 Other synovitis and tenosynovitis, left shoulder: Secondary | ICD-10-CM | POA: Diagnosis not present

## 2022-10-09 DIAGNOSIS — J449 Chronic obstructive pulmonary disease, unspecified: Secondary | ICD-10-CM | POA: Diagnosis not present

## 2022-10-09 DIAGNOSIS — N318 Other neuromuscular dysfunction of bladder: Secondary | ICD-10-CM | POA: Diagnosis not present

## 2022-10-09 DIAGNOSIS — F411 Generalized anxiety disorder: Secondary | ICD-10-CM | POA: Diagnosis not present

## 2022-10-09 DIAGNOSIS — F3341 Major depressive disorder, recurrent, in partial remission: Secondary | ICD-10-CM | POA: Diagnosis not present

## 2022-10-09 DIAGNOSIS — M19012 Primary osteoarthritis, left shoulder: Secondary | ICD-10-CM | POA: Diagnosis not present

## 2022-10-09 DIAGNOSIS — F5101 Primary insomnia: Secondary | ICD-10-CM | POA: Diagnosis not present

## 2022-10-09 DIAGNOSIS — L309 Dermatitis, unspecified: Secondary | ICD-10-CM | POA: Diagnosis not present

## 2022-10-09 DIAGNOSIS — G2581 Restless legs syndrome: Secondary | ICD-10-CM | POA: Diagnosis not present

## 2022-10-09 DIAGNOSIS — K589 Irritable bowel syndrome without diarrhea: Secondary | ICD-10-CM | POA: Diagnosis not present

## 2022-10-09 DIAGNOSIS — M159 Polyosteoarthritis, unspecified: Secondary | ICD-10-CM | POA: Diagnosis not present

## 2022-10-09 DIAGNOSIS — R600 Localized edema: Secondary | ICD-10-CM | POA: Diagnosis not present

## 2022-10-09 DIAGNOSIS — J069 Acute upper respiratory infection, unspecified: Secondary | ICD-10-CM | POA: Diagnosis not present

## 2022-10-16 DIAGNOSIS — F411 Generalized anxiety disorder: Secondary | ICD-10-CM | POA: Diagnosis not present

## 2022-10-16 DIAGNOSIS — R600 Localized edema: Secondary | ICD-10-CM | POA: Diagnosis not present

## 2022-10-16 DIAGNOSIS — L930 Discoid lupus erythematosus: Secondary | ICD-10-CM | POA: Diagnosis not present

## 2022-10-16 DIAGNOSIS — J449 Chronic obstructive pulmonary disease, unspecified: Secondary | ICD-10-CM | POA: Diagnosis not present

## 2022-10-16 DIAGNOSIS — G2581 Restless legs syndrome: Secondary | ICD-10-CM | POA: Diagnosis not present

## 2022-10-16 DIAGNOSIS — K589 Irritable bowel syndrome without diarrhea: Secondary | ICD-10-CM | POA: Diagnosis not present

## 2022-10-16 DIAGNOSIS — F5101 Primary insomnia: Secondary | ICD-10-CM | POA: Diagnosis not present

## 2022-10-16 DIAGNOSIS — F3341 Major depressive disorder, recurrent, in partial remission: Secondary | ICD-10-CM | POA: Diagnosis not present

## 2022-10-16 DIAGNOSIS — N318 Other neuromuscular dysfunction of bladder: Secondary | ICD-10-CM | POA: Diagnosis not present

## 2022-10-16 DIAGNOSIS — M159 Polyosteoarthritis, unspecified: Secondary | ICD-10-CM | POA: Diagnosis not present

## 2022-10-16 DIAGNOSIS — M65812 Other synovitis and tenosynovitis, left shoulder: Secondary | ICD-10-CM | POA: Diagnosis not present

## 2022-10-16 DIAGNOSIS — L309 Dermatitis, unspecified: Secondary | ICD-10-CM | POA: Diagnosis not present

## 2022-10-31 DIAGNOSIS — L309 Dermatitis, unspecified: Secondary | ICD-10-CM | POA: Diagnosis not present

## 2022-10-31 DIAGNOSIS — H1033 Unspecified acute conjunctivitis, bilateral: Secondary | ICD-10-CM | POA: Diagnosis not present

## 2022-10-31 DIAGNOSIS — N318 Other neuromuscular dysfunction of bladder: Secondary | ICD-10-CM | POA: Diagnosis not present

## 2022-10-31 DIAGNOSIS — F411 Generalized anxiety disorder: Secondary | ICD-10-CM | POA: Diagnosis not present

## 2022-10-31 DIAGNOSIS — I2089 Other forms of angina pectoris: Secondary | ICD-10-CM | POA: Diagnosis not present

## 2022-10-31 DIAGNOSIS — L930 Discoid lupus erythematosus: Secondary | ICD-10-CM | POA: Diagnosis not present

## 2022-10-31 DIAGNOSIS — M159 Polyosteoarthritis, unspecified: Secondary | ICD-10-CM | POA: Diagnosis not present

## 2022-10-31 DIAGNOSIS — G2581 Restless legs syndrome: Secondary | ICD-10-CM | POA: Diagnosis not present

## 2022-10-31 DIAGNOSIS — J449 Chronic obstructive pulmonary disease, unspecified: Secondary | ICD-10-CM | POA: Diagnosis not present

## 2022-10-31 DIAGNOSIS — F3341 Major depressive disorder, recurrent, in partial remission: Secondary | ICD-10-CM | POA: Diagnosis not present

## 2022-10-31 DIAGNOSIS — F5101 Primary insomnia: Secondary | ICD-10-CM | POA: Diagnosis not present

## 2022-10-31 DIAGNOSIS — K589 Irritable bowel syndrome without diarrhea: Secondary | ICD-10-CM | POA: Diagnosis not present

## 2023-02-06 ENCOUNTER — Telehealth: Payer: Self-pay

## 2023-02-06 NOTE — Telephone Encounter (Signed)
Transition Care Management Unsuccessful Follow-up Telephone Call  Date of discharge and from where:  01/30/2023 [facility]  Attempts:  1st Attempt  Reason for unsuccessful TCM follow-up call:  Left voice message  Breanna Hughes [place]  [facility] Community Resource Care Guide   [email or web address]  ?? [phone]   Website: [email or web address]      

## 2023-02-08 ENCOUNTER — Telehealth: Payer: Self-pay

## 2023-02-08 NOTE — Telephone Encounter (Signed)
Transition Care Management Unsuccessful Follow-up Telephone Call  Date of discharge and from where:  01/30/2023 [facility]  Attempts:  2nd Attempt  Reason for unsuccessful TCM follow-up call:  Left voice message  Gleen Ripberger [place]  [facility] Community Resource Care Guide   [email or web address]  ?? [phone]   Website: [email or web address]  [email or web address]      

## 2023-10-30 ENCOUNTER — Encounter: Payer: Self-pay | Admitting: Nurse Practitioner

## 2023-11-29 ENCOUNTER — Institutional Professional Consult (permissible substitution): Payer: PPO | Admitting: Pulmonary Disease

## 2023-12-20 ENCOUNTER — Ambulatory Visit: Payer: Self-pay | Admitting: Nurse Practitioner

## 2023-12-20 ENCOUNTER — Encounter: Payer: Self-pay | Admitting: Nurse Practitioner

## 2023-12-20 ENCOUNTER — Other Ambulatory Visit (INDEPENDENT_AMBULATORY_CARE_PROVIDER_SITE_OTHER)

## 2023-12-20 VITALS — BP 120/68 | HR 83 | Ht 64.0 in | Wt 83.0 lb

## 2023-12-20 DIAGNOSIS — Z8709 Personal history of other diseases of the respiratory system: Secondary | ICD-10-CM

## 2023-12-20 DIAGNOSIS — R1310 Dysphagia, unspecified: Secondary | ICD-10-CM

## 2023-12-20 DIAGNOSIS — R079 Chest pain, unspecified: Secondary | ICD-10-CM

## 2023-12-20 DIAGNOSIS — R1032 Left lower quadrant pain: Secondary | ICD-10-CM

## 2023-12-20 DIAGNOSIS — K5900 Constipation, unspecified: Secondary | ICD-10-CM

## 2023-12-20 DIAGNOSIS — Z87891 Personal history of nicotine dependence: Secondary | ICD-10-CM

## 2023-12-20 DIAGNOSIS — R634 Abnormal weight loss: Secondary | ICD-10-CM

## 2023-12-20 DIAGNOSIS — Z8679 Personal history of other diseases of the circulatory system: Secondary | ICD-10-CM

## 2023-12-20 DIAGNOSIS — R0789 Other chest pain: Secondary | ICD-10-CM

## 2023-12-20 LAB — CBC WITH DIFFERENTIAL/PLATELET
Basophils Absolute: 0 10*3/uL (ref 0.0–0.1)
Basophils Relative: 0.5 % (ref 0.0–3.0)
Eosinophils Absolute: 0.1 10*3/uL (ref 0.0–0.7)
Eosinophils Relative: 1.5 % (ref 0.0–5.0)
HCT: 41.1 % (ref 36.0–46.0)
Hemoglobin: 13.6 g/dL (ref 12.0–15.0)
Lymphocytes Relative: 17.3 % (ref 12.0–46.0)
Lymphs Abs: 1.4 10*3/uL (ref 0.7–4.0)
MCHC: 33.1 g/dL (ref 30.0–36.0)
MCV: 92.1 fl (ref 78.0–100.0)
Monocytes Absolute: 0.7 10*3/uL (ref 0.1–1.0)
Monocytes Relative: 9 % (ref 3.0–12.0)
Neutro Abs: 5.6 10*3/uL (ref 1.4–7.7)
Neutrophils Relative %: 71.7 % (ref 43.0–77.0)
Platelets: 259 10*3/uL (ref 150.0–400.0)
RBC: 4.46 Mil/uL (ref 3.87–5.11)
RDW: 13 % (ref 11.5–15.5)
WBC: 7.8 10*3/uL (ref 4.0–10.5)

## 2023-12-20 LAB — COMPREHENSIVE METABOLIC PANEL WITH GFR
ALT: 22 U/L (ref 0–35)
AST: 23 U/L (ref 0–37)
Albumin: 4.4 g/dL (ref 3.5–5.2)
Alkaline Phosphatase: 76 U/L (ref 39–117)
BUN: 15 mg/dL (ref 6–23)
CO2: 33 meq/L — ABNORMAL HIGH (ref 19–32)
Calcium: 9.8 mg/dL (ref 8.4–10.5)
Chloride: 100 meq/L (ref 96–112)
Creatinine, Ser: 0.59 mg/dL (ref 0.40–1.20)
GFR: 93.44 mL/min (ref >60.00)
Glucose, Bld: 104 mg/dL — ABNORMAL HIGH (ref 70–99)
Potassium: 4.1 meq/L (ref 3.5–5.1)
Sodium: 140 meq/L (ref 135–145)
Total Bilirubin: 0.4 mg/dL (ref 0.2–1.2)
Total Protein: 7.3 g/dL (ref 6.0–8.3)

## 2023-12-20 NOTE — Progress Notes (Signed)
 12/20/2023 Breanna Hughes [id number] 1956-03-25   CHIEF COMPLAINT: Difficulty swallowing   HISTORY OF PRESENT ILLNESS: Breanna Hughes is a 68 year old female with past medical history of anxiety, depression, fibromyalgia, asthma, COPD, CAD and Lupus.  Previously known by gastroenterologist Breanna Hughes and Breanna Hughes in [place].  She presents to our office today as referred by Breanna Hughes for further evaluation regarding dysphagia and weight loss.  She is accompanied by her daughter Breanna Hughes. Starting in September 2024, she developed dysphagia after eating peanuts and grapes.  Since then, she feels like food gets stuck in her throat.  She was seen by an ENT 07/2023 and her daughter stated a laryngoscopy was normal.  She subsequently underwent a barium swallow study at [facility] which showed some abnormality and she was advised to undergo further GI evaluation.  She is on Esomeprazole 40 mg daily. She describes having difficulty swallowing most foods, food gets stuck in her throat and passes after she drinks water or Dr. Reino Hughes.  She sometimes uses a saline nasal spray and that loosen secretions up then sometimes "hacks up" pieces of food.  She has lost 6 - 7 pounds over the past 6 months.  She has constipation for the past 6 months for which she intermittently takes Linzess which resulted in passing loose stools.  No bloody or black stools. She has intermittent LLQ pain, sometimes buckles her over and occurs a few times monthly. No LLQ pain at this time. She has a history of CAD and has not seen a cardiologist in many years.  She denies ever having an MI or PCI/stent placement.  She has intermittent right chest pain which radiates to the mid chest and into the right jaw which occurs once monthly and lasts 1 to 2 minutes and is relieved after taking sublingual nitroglycerin.  She sometimes has mid back pain which radiates through to the chest.  No nausea or vomiting.  She underwent an EGD  01/2016 which showed mild gastritis.  She underwent a colonoscopy 12/2013 which was normal.  She denies having any further colonoscopies since then.  No known family history of esophageal, gastric or colorectal cancer.  PAST GI PROCEDURES:  EGD 01/24/2016  by Breanna Hughes: - Normal esophagus.  - Mild, non-specific gastritis. Biopsied.  - Normal examined duodenum. BENIGN GASTRIC MUCOSA. NO HELICOBACTER PYLORI, INTESTINAL METAPLASIA OR ACTIVE INFLAMMATION IDENTIFIED.  EGD 12/24/2013: Normal esophagus, stomach and duodenum Bile reflux noted Path report:  Duodenal biopsies: No evidence of celiac sprue Gastric antrum biopsies: No intestinal metaplasia identified No Helicobacter pylori  Colonoscopy 12/24/2013: Normal colonoscopy   Past Medical History:  Diagnosis Date   Anxiety state, unspecified    Arthritis    Asthma    CAD (coronary artery disease)    Chronic airway obstruction, not elsewhere classified    Colon polyps    COPD (chronic obstructive pulmonary disease) (HCC)    Depressive disorder, not elsewhere classified    Edema    Esophageal reflux    Fibromyalgia    Generalized osteoarthrosis, involving multiple sites    Hypertonicity of bladder    Insomnia, unspecified    Irritable bowel syndrome    Loss of weight    Lupus    Nausea alone    Pneumonia    Symptomatic menopausal or female climacteric states    Unspecified constipation    Past Surgical History:  Procedure Laterality Date   ABDOMINAL HYSTERECTOMY  01/24/2016   APPENDECTOMY Left  1976   BREAST LUMPECTOMY     x 2   CEREBRAL ANEURYSM REPAIR     LUMBAR DISC SURGERY     x 3   Social History: She is widowed. She has 2 daughters. She is retired. She smoked cigarettes since age 34, quit smoking 2011. No alcohol use. No drug use.   Family History: Father had emphysema. Mother had breast cancer. Brother with ? Autoimmune disease. Brother with heart disease. No known family history of esophageal, gastric or colon  cancer.   Allergies  Allergen Reactions   Other     Darvocet-"deathly" sick   Nubain [Nalbuphine Hcl]    Prednisone     Unsure of reaction   Prozac [Fluoxetine Hcl] Other (See Comments)    unknown      Outpatient Encounter Medications as of 12/20/2023  Medication Sig   albuterol (VENTOLIN HFA) 108 (90 BASE) MCG/ACT inhaler Inhale 2 puffs into the lungs every 6 (six) hours as needed for wheezing or shortness of breath.   ALPRAZolam (XANAX) 0.25 MG tablet Take 0.25 mg by mouth every 6 (six) hours as needed.   buprenorphine (SUBUTEX) 8 MG SUBL SL tablet Place 24 mg under the tongue daily as needed.   esomeprazole (NEXIUM) 40 MG capsule Take by mouth.   traZODone (DESYREL) 50 MG tablet Take 2 tablets per night as needed   clonazePAM (KLONOPIN) 0.5 MG tablet Take 0.5 mg by mouth every 8 (eight) hours as needed for anxiety. (Patient not taking: Reported on 12/20/2023)   estradiol (ESTRACE) 2 MG tablet Take 1 tablet by mouth daily. (Patient not taking: Reported on 12/20/2023)   fentaNYL (DURAGESIC - DOSED MCG/HR) 50 MCG/HR Place 1 patch on skin every 3 days (Patient not taking: Reported on 12/20/2023)   Fluticasone-Umeclidin-Vilant (TRELEGY ELLIPTA) 100-62.5-25 MCG/INH AEPB Inhale 1 puff into the lungs daily. (Patient not taking: Reported on 12/20/2023)   HYDROcodone-acetaminophen (LORTAB) 7.5-500 MG per tablet Take 1 tablet by mouth every 6 (six) hours as needed for pain. (Patient not taking: Reported on 12/20/2023)   Multiple Vitamin (MULTIVITAMIN) tablet Take 1 tablet by mouth daily. (Patient not taking: Reported on 12/20/2023)   Nebulizers (COMPRESSOR NEBULIZER) MISC 1 Device by Does not apply route once. (Patient not taking: Reported on 12/20/2023)   nitroGLYCERIN (NITROSTAT) 0.4 MG SL tablet Place 0.4 mg under the tongue every 5 (five) minutes as needed for chest pain. Reported on 12/14/2015 (Patient not taking: Reported on 12/20/2023)   ondansetron (ZOFRAN-ODT) 8 MG disintegrating tablet Take 8 mg  by mouth 2 (two) times daily as needed for nausea or vomiting. (Patient not taking: Reported on 12/20/2023)   sertraline (ZOLOFT) 100 MG tablet Take 100 mg by mouth daily.  (Patient not taking: Reported on 12/20/2023)   [DISCONTINUED] calcium carbonate (OS-CAL) 600 MG TABS tablet Take 600 mg by mouth 2 (two) times daily with a meal.   [DISCONTINUED] cetirizine (ZYRTEC) 10 MG tablet Take 10 mg by mouth daily.   No facility-administered encounter medications on file as of 12/20/2023.    REVIEW OF SYSTEMS:  Gen: See HPI. CV:See HPI. Resp: Denies cough, shortness of breath of hemoptysis.  GI: See HPI. GU: Denies urinary burning, blood in urine, increased urinary frequency or incontinence. MS: Denies joint pain, muscles aches or weakness. Derm: Denies rash, itchiness, skin lesions or unhealing ulcers. Psych: + Anxiety and depression. Heme: Denies bruising, easy bleeding. Neuro:  Denies headaches, dizziness or paresthesias. Endo:  Denies any problems with DM, thyroid or adrenal function.  PHYSICAL  EXAM: BP 120/68   Pulse 83   Ht 5\' 4"  (1.626 m)   Wt 83 lb (37.6 kg)   BMI 14.25 kg/m   Wt Readings from Last 3 Encounters:  12/20/23 83 lb (37.6 kg)  11/27/16 94 lb (42.6 kg)  03/06/16 94 lb 6.4 oz (42.8 kg)    General: 68 year old female cachectic appearing in no acute distress. Head: Normocephalic and atraumatic. Eyes:  Sclerae non-icteric, conjunctive pink. Ears: Normal auditory acuity. Mouth: Dentition intact. No ulcers or lesions.  Neck: Supple, no lymphadenopathy or thyromegaly.  Lungs: Clear bilaterally to auscultation without wheezes, crackles or rhonchi. Heart: Regular rate and rhythm. No murmur, rub or gallop appreciated.  Abdomen: Soft, nontender, nondistended. No masses. No hepatosplenomegaly. Normoactive bowel sounds x 4 quadrants. Shotty left inguinal lymph nodes.  Rectal: Deferred.  Musculoskeletal: Symmetrical with no gross deformities. Skin: Warm and dry. No rash or  lesions on visible extremities. Extremities: No edema. Neurological: Alert oriented x 4, no focal deficits.  Psychological:  Alert and cooperative. Normal mood and affect.  ASSESSMENT AND PLAN:  68 year old female with dysphagia and weight loss x 6 months -EGD benefits and risks discussed including risk with sedation, risk of bleeding, perforation and infection  -EGD to be scheduled after cardiac clearance received -Request copy of barium swallow study from [facility] -CTAP with oral and IV contrast, BUN and creatinine level to be reviewed prior to the patient receiving IV contrast  Colon cancer screening.  Normal colonoscopy 12/2013.  No known family history of colorectal cancer. -Colonoscopy recommended at the time of EGD, patient uncertain if she wishes to pursue a future colonoscopy -Cardiac clearance required prior to considering future colonoscopy  LLQ pain, intermittent  -CTAP and labs as ordered above -Eventual colonoscopy if patient willing  -Patient to go to the emergency room if she develops severe abdominal pain  Constipation, on Linzess as needed  History of coronary artery disease with atypical right-sided chest pain which radiates to the mid chest and jaw relieved by taking sublingual NTG -Cardiology referral, patient requests cardiologist in [place].  Cardiology consult to include cardiac clearance prior to scheduling EGD and possible colonoscopy  History of COPD, past smoker -Recommend chest CT for lung cancer screening, defer to PCP       CC:  Breanna Curia, MD

## 2023-12-20 NOTE — Patient Instructions (Addendum)
 Continue Esomeprazole 40 mg daily.  Please purchase the following: Ensure or Boost 2 cans/bottles daily  We have referred you to a cardiologist in [facility] for your chest pain. They will contact you with an appointment if not please contact their office at [phone].  Your provider has requested that you go to the basement level for lab work before leaving today. Press "B" on the elevator. The lab is located at the first door on the left as you exit the elevator.  You have been scheduled for a CT scan of the abdomen and pelvis at [facility]. You are scheduled on 01/10/24 at 3:00pm, ariving at 1:00pm. Please follow the written instructions below on the day of your exam:   1) Do not eat anything after 11:00am (4 hours prior to your test)    You may take any medications as prescribed with a small amount of water, if necessary. If you take any of the following medications: METFORMIN, GLUCOPHAGE, GLUCOVANCE, AVANDAMET, RIOMET, FORTAMET, ACTOPLUS MET, JANUMET, GLUMETZA or METAGLIP, you MAY be asked to HOLD this medication 48 hours AFTER the exam.   The purpose of you drinking the oral contrast is to aid in the visualization of your intestinal tract. The contrast solution may cause some diarrhea. Depending on your individual set of symptoms, you may also receive an intravenous injection of x-ray contrast/dye. Plan on being at [facility] for 45 minutes or longer, depending on the type of exam you are having performed.   If you have any questions regarding your exam or if you need to reschedule, you may call [facility] at [phone] between the hours of 8:00 am and 5:00 pm, Monday-Friday.   Due to recent changes in healthcare laws, you may see the results of your imaging and laboratory studies on MyChart before your provider has had a chance to review them.  We understand that in some cases there may be results that are confusing or concerning to you. Not all laboratory results  come back in the same time frame and the provider may be waiting for multiple results in order to interpret others.  Please give [place] 48 hours in order for your provider to thoroughly review all the results before contacting the office for clarification of your results.

## 2023-12-21 LAB — TSH: TSH: 1.94 u[IU]/mL (ref 0.35–5.50)

## 2023-12-21 NOTE — Progress Notes (Signed)
 Noted.

## 2023-12-31 ENCOUNTER — Telehealth: Payer: Self-pay

## 2023-12-31 NOTE — Telephone Encounter (Signed)
 Patient has an appointment with Cardiology in [place] on 01-07-24

## 2023-12-31 NOTE — Telephone Encounter (Signed)
-----   Message from [facility] Maryanna Shape sent at 12/20/2023  3:54 PM EDT ----- Patient was seen by Alcide Evener, NP on 12/20/23. Patient was being seen for dysphagia, weight loss, and constipation. Patient mentioned she has had ongoing chest pain, relieved with nitroglycerin. Referral was send to [facility] health cardiology in [facility] for urgent referral for chest pain. Patient needs to see cardiology and be cleared prior to having EGD/Colonoscopy. Check on referral.

## 2024-01-03 ENCOUNTER — Ambulatory Visit: Payer: PPO | Admitting: Pulmonary Disease

## 2024-01-03 ENCOUNTER — Encounter: Payer: Self-pay | Admitting: Pulmonary Disease

## 2024-01-03 VITALS — BP 116/80 | HR 89 | Ht 64.0 in | Wt 84.4 lb

## 2024-01-03 DIAGNOSIS — J449 Chronic obstructive pulmonary disease, unspecified: Secondary | ICD-10-CM

## 2024-01-03 MED ORDER — BREZTRI AEROSPHERE 160-9-4.8 MCG/ACT IN AERO: 2.0000 | INHALATION_SPRAY | Freq: Two times a day (BID) | RESPIRATORY_TRACT | 12 refills | Status: DC

## 2024-01-03 NOTE — Progress Notes (Signed)
 @Patient  ID: Breanna Hughes, female    DOB: Apr 07, 1956, 68 y.o.   MRN: [id number]  Chief Complaint  Patient presents with   Consult    Pt states she always had COPD , but back in 12/24 it seem to have got worse    Referring provider: Simone Curia, MD  HPI:   68 y.o. woman whom are seen for evaluation of COPD.  Multiple prior pulmonary notes in the past reviewed.  Note from referring provider reviewed.  Patient with COPD.  Present for many years.  Last seen in pulmonary office I think in 2018.  Has been off maintenance inhaler for some time now.  Has baseline dyspnea that occasionally worsens.  But she has managed for many years with use of as needed albuterol via HFA and nebulizer.  Unfortunately things worsened over the last several months.  Sounds like viral infection.  More symptomatic.  Been using DuoNebs and Combivent now for rescue.  Using multiple times a day.  Has not been restarted on a maintenance inhaler.  She reports multiple episodes of worsening breathing throughout the year requiring prednisone.  Prednisone reliably improves things.  She had PFTs in 2015 fully interpreted below but these are consistent with moderate COPD.  Most recent CT chest 2018 personally reviewed with impressive and significant emphysema throughout.  Questionaires / Pulmonary Flowsheets:   ACT:      No data to display          MMRC:     No data to display          Epworth:      No data to display          Tests:   FENO:  No results found for: "NITRICOXIDE"  PFT:    Latest Ref Rng & Units 04/07/2014    2:11 PM  PFT Results  FVC-Pre L 3.08   FVC-Predicted Pre % 91   FVC-Post L 3.10   FVC-Predicted Post % 91   Pre FEV1/FVC % % 50   Post FEV1/FCV % % 49   FEV1-Pre L 1.54   FEV1-Predicted Pre % 58   FEV1-Post L 1.52   DLCO uncorrected ml/min/mmHg 12.98   DLCO UNC% % 53   DLVA Predicted % 53   TLC L 5.73   TLC % Predicted % 113   RV % Predicted % 131   Personally viewed  interpreted as moderate COPD, lung volumes with air trapping, DLCO moderately reduced  WALK:     04/07/2014    8:03 PM 04/07/2014    3:10 PM  SIX MIN WALK  Medications  Norco 7.5/500mg  and Fentanyl patch @ 10:30am  Supplimental Oxygen during Test? (L/min)  No  Laps  8  Partial Lap (in Meters)  34.5 meters  Baseline BP (sitting)  112/68  Baseline Heartrate  92  Baseline Dyspnea (Borg Scale)  1  Baseline Fatigue (Borg Scale)  4  Baseline SPO2  94 %  BP (sitting)  120/64  Heartrate  93  Dyspnea (Borg Scale)  4  Fatigue (Borg Scale)  7  SPO2  89 %  BP (sitting)  104/70  Heartrate  93  SPO2  96 %  Stopped or Paused before Six Minutes  No  Interpretation  Hip pain;Leg pain;Calf pain  Distance Completed  418.5 meters  Tech Comments:  pt c/o increased SOB at end of test--89% O2. Pt c/o calf pain.leg pain, back pain and hip pain during test. Pt wore brace on R  leg during test. -- [facility]  Provider Comments: 94%, 89%, 96%, 418.5 m with complaint of leg, back and hip pains related to known arthritis. No oxygen limitation on this test     Imaging: Personally reviewed as per EMR and discussion in this note No results found.  Lab Results: Personally reviewed CBC    Component Value Date/Time   WBC 7.8 12/20/2023 1533   RBC 4.46 12/20/2023 1533   HGB 13.6 12/20/2023 1533   HCT 41.1 12/20/2023 1533   PLT 259.0 12/20/2023 1533   MCV 92.1 12/20/2023 1533   MCHC 33.1 12/20/2023 1533   RDW 13.0 12/20/2023 1533   LYMPHSABS 1.4 12/20/2023 1533   MONOABS 0.7 12/20/2023 1533   EOSABS 0.1 12/20/2023 1533   BASOSABS 0.0 12/20/2023 1533    BMET    Component Value Date/Time   NA 140 12/20/2023 1533   K 4.1 12/20/2023 1533   CL 100 12/20/2023 1533   CO2 33 (H) 12/20/2023 1533   GLUCOSE 104 (H) 12/20/2023 1533   BUN 15 12/20/2023 1533   CREATININE 0.59 12/20/2023 1533   CALCIUM 9.8 12/20/2023 1533   GFRNONAA >60 05/01/2007 0756   GFRAA  05/01/2007 0756    >60        The eGFR has been  calculated using the MDRD equation. This calculation has not been validated in all clinical    BNP No results found for: "BNP"  ProBNP No results found for: "PROBNP"  Specialty Problems       Pulmonary Problems   COPD mixed type (HCC)   6 MWT 04/07/14- 94%, 89%, 96%, 418.5 m with complaint of leg, back and hip pains related to known arthritis. No oxygen limitation on this test PFT 04/07/14- moderately severe obstructive airways disease-emphysema, moderately severe diffusion, insignificant response to bronchodilator. FVC 3.10/91%, FEV1 1.52/58%, FEV1/FVC 0.49, FEF 25-75% 0.66, TLC 113%, RV 131%, DLCO 53%.        Allergies  Allergen Reactions   Other     Darvocet-"deathly" sick   Nubain [Nalbuphine Hcl]    Prozac [Fluoxetine Hcl] Other (See Comments)    unknown    Immunization History  Administered Date(s) Administered   Influenza Split 06/25/2013, 06/25/2014, 07/07/2015, 06/25/2016   Influenza-Unspecified 06/25/2014   Pneumococcal Polysaccharide-23 09/25/2008, 02/19/2014    Past Medical History:  Diagnosis Date   Anxiety state, unspecified    Arthritis    Asthma    CAD (coronary artery disease)    Chronic airway obstruction, not elsewhere classified    Colon polyps    COPD (chronic obstructive pulmonary disease) (HCC)    Depressive disorder, not elsewhere classified    Edema    Esophageal reflux    Fibromyalgia    Generalized osteoarthrosis, involving multiple sites    Hypertonicity of bladder    Insomnia, unspecified    Irritable bowel syndrome    Loss of weight    Lupus    Nausea alone    Pneumonia    Symptomatic menopausal or female climacteric states    Unspecified constipation     Tobacco History: Social History   Tobacco Use  Smoking Status Former   Current packs/day: 0.00   Types: Cigarettes   Start date: 09/26/1979   Quit date: 09/25/2009   Years since quitting: 14.2  Smokeless Tobacco Never   Counseling given: Not Answered   Continue  to not smoke  Outpatient Encounter Medications as of 01/03/2024  Medication Sig   ALPRAZolam (XANAX) 0.25 MG tablet Take 0.25 mg by  mouth every 6 (six) hours as needed.   budeson-glycopyrrolate-formoterol (BREZTRI AEROSPHERE) 160-9-4.8 MCG/ACT AERO inhaler Inhale 2 puffs into the lungs in the morning and at bedtime.   buprenorphine (SUBUTEX) 8 MG SUBL SL tablet Place 24 mg under the tongue daily as needed.   COMBIVENT RESPIMAT 20-100 MCG/ACT AERS respimat SMARTSIG:1 Puff(s) Via Inhaler 6 Times Daily   esomeprazole (NEXIUM) 40 MG capsule Take by mouth.   estradiol (ESTRACE) 2 MG tablet Take 1 tablet by mouth daily.   Multiple Vitamin (MULTIVITAMIN) tablet Take 1 tablet by mouth daily.   Nebulizers (COMPRESSOR NEBULIZER) MISC 1 Device by Does not apply route once.   nitroGLYCERIN (NITROSTAT) 0.4 MG SL tablet Place 0.4 mg under the tongue every 5 (five) minutes as needed for chest pain. Reported on 12/14/2015   ondansetron (ZOFRAN-ODT) 8 MG disintegrating tablet Take 8 mg by mouth 2 (two) times daily as needed for nausea or vomiting.   sertraline (ZOLOFT) 100 MG tablet Take 100 mg by mouth daily.   traZODone (DESYREL) 50 MG tablet Take 2 tablets per night as needed   [DISCONTINUED] albuterol (VENTOLIN HFA) 108 (90 BASE) MCG/ACT inhaler Inhale 2 puffs into the lungs every 6 (six) hours as needed for wheezing or shortness of breath.   [DISCONTINUED] clonazePAM (KLONOPIN) 0.5 MG tablet Take 0.5 mg by mouth every 8 (eight) hours as needed for anxiety.   [DISCONTINUED] fentaNYL (DURAGESIC - DOSED MCG/HR) 50 MCG/HR Place 1 patch on skin every 3 days   [DISCONTINUED] Fluticasone-Umeclidin-Vilant (TRELEGY ELLIPTA) 100-62.5-25 MCG/INH AEPB Inhale 1 puff into the lungs daily.   [DISCONTINUED] HYDROcodone-acetaminophen (LORTAB) 7.5-500 MG per tablet Take 1 tablet by mouth every 6 (six) hours as needed for pain.   [DISCONTINUED] SPIRIVA RESPIMAT 2.5 MCG/ACT AERS SMARTSIG:1 inhalation Via Inhaler Twice Daily    No facility-administered encounter medications on file as of 01/03/2024.     Review of Systems  Review of Systems  No chest pain with exertion.  Orthopnea or PND.  Comprehensive review of systems otherwise negative. Physical Exam  BP 116/80 (BP Location: Right Arm, Patient Position: Sitting, Cuff Size: Normal)   Pulse 89   Ht 5\' 4"  (1.626 m)   Wt 84 lb 6.4 oz (38.3 kg)   SpO2 93%   BMI 14.49 kg/m   Wt Readings from Last 5 Encounters:  01/03/24 84 lb 6.4 oz (38.3 kg)  12/20/23 83 lb (37.6 kg)  11/27/16 94 lb (42.6 kg)  03/06/16 94 lb 6.4 oz (42.8 kg)  01/24/16 92 lb (41.7 kg)    BMI Readings from Last 5 Encounters:  01/03/24 14.49 kg/m  12/20/23 14.25 kg/m  11/27/16 16.14 kg/m  03/06/16 16.20 kg/m  01/24/16 15.79 kg/m     Physical Exam General: Thin, chronically ill, sitting up in chair Eyes: EOMI, icterus Neck: Supple, no JVP Pulmonary: Distant, clear Cardiovascular: Warm, no edema Abdomen: Nondistended MSK: No synovitis, no joint effusion Neuro: Normal gait, no weakness Psych: Normal mood, full affect   Assessment & Plan:   COPD: Moderate in 2015 with air trapping.  Severe and significant emphysema on prior CT scan chest.  Worsening over time symptomatically.  Not currently on maintenance inhaler.  Start Breztri with multiple exacerbations and prednisone courses over the year.  Continue Combivent and DuoNebs as needed for rescue.   Return in about 3 months (around 04/03/2024) for f/u Dr. Judeth Horn.   Karren Burly, MD 01/03/2024   This appointment required 61 minutes of patient care (this includes precharting, chart review, review of  results, face-to-face care, etc.).

## 2024-01-03 NOTE — Patient Instructions (Signed)
 Nice to meet you  Use Breztri 2 puffs twice a day every day, rinse your mouth out with water after every use.  Hope this will help your symptoms day-to-day would also help prevent exacerbations and need for prednisone for as long as possible  If this inhaler is too expensive please send [place] a message and we will look for an alternative  If you feel you need prednisone and antibiotic please call us and we can help provide this.  Were happy to see you but if you are unable to see you hopefully we can just call this in.  Return to clinic in 3 months or sooner as needed with Dr. Judeth Horn

## 2024-01-07 ENCOUNTER — Encounter: Payer: Self-pay | Admitting: Cardiology

## 2024-01-07 ENCOUNTER — Ambulatory Visit: Attending: Cardiology | Admitting: Cardiology

## 2024-01-07 VITALS — BP 120/80 | HR 85 | Ht 64.0 in | Wt 84.0 lb

## 2024-01-07 DIAGNOSIS — R112 Nausea with vomiting, unspecified: Secondary | ICD-10-CM | POA: Diagnosis not present

## 2024-01-07 DIAGNOSIS — R0609 Other forms of dyspnea: Secondary | ICD-10-CM

## 2024-01-07 DIAGNOSIS — K219 Gastro-esophageal reflux disease without esophagitis: Secondary | ICD-10-CM

## 2024-01-07 DIAGNOSIS — R071 Chest pain on breathing: Secondary | ICD-10-CM

## 2024-01-07 DIAGNOSIS — R072 Precordial pain: Secondary | ICD-10-CM

## 2024-01-07 DIAGNOSIS — R0789 Other chest pain: Secondary | ICD-10-CM | POA: Diagnosis not present

## 2024-01-07 MED ORDER — METOPROLOL TARTRATE 100 MG PO TABS: ORAL_TABLET | ORAL | 0 refills | Status: DC

## 2024-01-07 NOTE — Progress Notes (Unsigned)
 Cardiology Consultation:    Date:  01/07/2024   ID:  Breanna Hughes, DOB 02-Dec-1955, MRN [id number]  PCP:  Breanna Curia, MD  Cardiologist:  Breanna Balsam, MD   Referring MD: Breanna Hughes *   Chief Complaint  Patient presents with   Medical Clearance    History of Present Illness:    Breanna Hughes is a 68 y.o. female who is being seen today for the evaluation of an need EGD a at the request of Breanna Hughes *.  Past medical history significant for some heart history apparently years ago she did see some cardiologist stress test has been done however she does not know the results of it she was given nitroglycerin and since that time she did not follow with anybody she started experiencing some difficulty swallowing with some food being stuck in her esophagus, also some pains that typically happen at night.  Interestingly she take nitroglycerin which relieved the pain.  She can walk climb stairs with some fatigue tiredness but no chest pain while doing it.  No dizziness no passing out overall she lost quite significant amount of weight she had been seeing gastroenterologist plans for EGD possibly manage she was sent to me to be evaluated from cardiac standpoint review for this procedure.  She quit smoking years ago, she does not have family history of premature coronary artery disease,  Past Medical History:  Diagnosis Date   Anxiety state, unspecified    Arthritis    Asthma    CAD (coronary artery disease)    Chronic airway obstruction, not elsewhere classified    Colon polyps    COPD (chronic obstructive pulmonary disease) (HCC)    Depressive disorder, not elsewhere classified    Edema    Esophageal reflux    Fibromyalgia    Generalized osteoarthrosis, involving multiple sites    Hypertonicity of bladder    Insomnia, unspecified    Irritable bowel syndrome    Loss of weight    Lupus    Nausea alone    Pneumonia    Symptomatic menopausal or female  climacteric states    Unspecified constipation     Past Surgical History:  Procedure Laterality Date   ABDOMINAL HYSTERECTOMY  01/24/2016   APPENDECTOMY Left 1976   BREAST LUMPECTOMY     x 2   CEREBRAL ANEURYSM REPAIR     LUMBAR DISC SURGERY     x 3    Current Medications: Current Meds  Medication Sig   ALPRAZolam (XANAX) 0.25 MG tablet Take 0.25 mg by mouth every 6 (six) hours as needed for anxiety.   budeson-glycopyrrolate-formoterol (BREZTRI AEROSPHERE) 160-9-4.8 MCG/ACT AERO inhaler Inhale 2 puffs into the lungs in the morning and at bedtime.   buprenorphine (SUBUTEX) 8 MG SUBL SL tablet Place 24 mg under the tongue daily as needed (OD).   COMBIVENT RESPIMAT 20-100 MCG/ACT AERS respimat Inhale 1 puff into the lungs every 6 (six) hours as needed for wheezing or shortness of breath.   esomeprazole (NEXIUM) 40 MG capsule Take 40 mg by mouth daily at 12 noon.   estradiol (ESTRACE) 2 MG tablet Take 1 tablet by mouth daily.   Multiple Vitamin (MULTIVITAMIN) tablet Take 1 tablet by mouth daily.   Nebulizers (COMPRESSOR NEBULIZER) MISC 1 Device by Does not apply route once.   nitroGLYCERIN (NITROSTAT) 0.4 MG SL tablet Place 0.4 mg under the tongue every 5 (five) minutes as needed for chest pain. Reported on 12/14/2015   ondansetron (ZOFRAN-ODT) 8  MG disintegrating tablet Take 8 mg by mouth 2 (two) times daily as needed for nausea or vomiting.   traZODone (DESYREL) 50 MG tablet Take 50 mg by mouth at bedtime as needed for sleep. Take 2 tablets per night as needed   [DISCONTINUED] sertraline (ZOLOFT) 100 MG tablet Take 100 mg by mouth daily.     Allergies:   Other, Nubain [nalbuphine hcl], and Prozac [fluoxetine hcl]   Social History   Socioeconomic History   Marital status: Widowed    Spouse name: Not on file   Number of children: 2   Years of education: Not on file   Highest education level: Not on file  Occupational History   Occupation: disabled  Tobacco Use   Smoking status:  Former    Current packs/day: 0.00    Types: Cigarettes    Start date: 09/26/1979    Quit date: 09/25/2009    Years since quitting: 14.2   Smokeless tobacco: Never  Vaping Use   Vaping status: Not on file  Substance and Sexual Activity   Alcohol use: No    Alcohol/week: 0.0 standard drinks of alcohol    Comment: quit 2005   Drug use: No   Sexual activity: Not on file  Other Topics Concern   Not on file  Social History Narrative   Not on file   Social Drivers of Health   Financial Resource Strain: Not on file  Food Insecurity: Not on file  Transportation Needs: Not on file  Physical Activity: Not on file  Stress: Not on file  Social Connections: Not on file     Family History: The patient's family history includes Allergies in her brother and mother; Autoimmune disease in her brother; Breast cancer in her cousin and mother; Emphysema in her father; Heart disease in her brother; Rheum arthritis in her maternal grandmother. There is no history of Liver disease, Esophageal cancer, or Colon cancer. ROS:   Please see the history of present illness.    All 14 point review of systems negative except as described per history of present illness.  EKGs/Labs/Other Studies Reviewed:    The following studies were reviewed today:   EKG: EKG showed normal sinus rhythm normal P interval normal QS complex duration morphology, no ST segment changes     Recent Labs: 12/20/2023: ALT 22; BUN 15; Creatinine, Ser 0.59; Hemoglobin 13.6; Platelets 259.0; Potassium 4.1; Sodium 140; TSH 1.94  Recent Lipid Panel No results found for: "CHOL", "TRIG", "HDL", "CHOLHDL", "VLDL", "LDLCALC", "LDLDIRECT"  Physical Exam:    VS:  BP 120/80 (BP Location: Right Arm, Patient Position: Sitting)   Pulse 85   Ht 5\' 4"  (1.626 m)   Wt 84 lb (38.1 kg)   SpO2 95%   BMI 14.42 kg/m     Wt Readings from Last 3 Encounters:  01/07/24 84 lb (38.1 kg)  01/03/24 84 lb 6.4 oz (38.3 kg)  12/20/23 83 lb (37.6 kg)      GEN:  Well nourished, well developed in no acute distress HEENT: Normal NECK: No JVD; No carotid bruits LYMPHATICS: No lymphadenopathy CARDIAC: RRR, no murmurs, no rubs, no gallops RESPIRATORY:  Clear to auscultation without rales, wheezing or rhonchi  ABDOMEN: Soft, non-tender, non-distended MUSCULOSKELETAL:  No edema; No deformity  SKIN: Warm and dry NEUROLOGIC:  Alert and oriented x 3 PSYCHIATRIC:  Normal affect   ASSESSMENT:    1. Chest pain on breathing   2. Atypical chest pain   3. Gastroesophageal reflux disease, unspecified whether  esophagitis present   4. Nausea and vomiting, unspecified vomiting type    PLAN:    In order of problems listed above:  Chest pain atypical suggesting GI pathology but apparently she got some abnormality of previous testing done couple years ago to have no access to she was given nitroglycerin but no follow-up since that time.  I think we need to rule out significant coronary artery disease.  I will schedule her to have coronary CT angio make sure she does not have any residual significant changes. I will schedule her also to have an echocardiogram done to assess left ventricle ejection fraction. GI issue clearly she does need gastroscopy.  Her story is quite worrisome chest pain after eating also without eating on top of that the significant weight loss that is very worrisome obviously.  Pain has been relieved by nitroglycerin which may indicate esophageal spasm.   Medication Adjustments/Labs and Tests Ordered: Current medicines are reviewed at length with the patient today.  Concerns regarding medicines are outlined above.  Orders Placed This Encounter  Procedures   EKG 12-Lead   No orders of the defined types were placed in this encounter.   Signed, Manfred Seed, MD, [facility]. 01/07/2024 4:14 PM    [facility]

## 2024-01-07 NOTE — Patient Instructions (Signed)
 Medication Instructions:   TAKE: Metoprolol 100mg  1 tablet 2 hours prior to CT Scan   Lab Work: None If you have labs (blood work) drawn today and your tests are completely normal, you will receive your results only by: MyChart Message (if you have MyChart) OR A paper copy in the mail If you have any lab test that is abnormal or we need to change your treatment, we will call you to review the results.   Testing/Procedures:  Your physician has requested that you have an echocardiogram. Echocardiography is a painless test that uses sound waves to create images of your heart. It provides your doctor with information about the size and shape of your heart and how well your heart's chambers and valves are working. This procedure takes approximately one hour. There are no restrictions for this procedure. Please do NOT wear cologne, perfume, aftershave, or lotions (deodorant is allowed). Please arrive 15 minutes prior to your appointment time.  Please note: We ask at that you not bring children with you during ultrasound (echo/ vascular) testing. Due to room size and safety concerns, children are not allowed in the ultrasound rooms during exams. Our front office staff cannot provide observation of children in our lobby area while testing is being conducted. An adult accompanying a patient to their appointment will only be allowed in the ultrasound room at the discretion of the ultrasound technician under special circumstances. We apologize for any inconvenience.  Your physician has requested that you have cardiac CT. Cardiac computed tomography (CT) is a painless test that uses an x-ray machine to take clear, detailed pictures of your heart. For further information please visit [email or web address] Please follow instruction sheet as given.    Your Cardiac CT will be scheduled at:   [facility] located off [facility] at the hospital.  Please arrive 30 minutes prior to  your appointment time.  You can use the FREE valet parking offered at entrance to outpatient center (encouraged to control the heart rate for the test)   Please follow these instructions carefully (unless otherwise directed):    On the Night Before the Test: Be sure to Drink plenty of water. Do not consume any caffeinated/decaffeinated beverages or chocolate 12 hours prior to your test. Do not take any antihistamines 12 hours prior to your test.   On the Day of the Test: Drink plenty of water until 1 hour prior to the test. Do not eat any food 4 hours prior to the test. No smoking 4 hours prior to test. You may take your regular medications prior to the test.  Take metoprolol (Lopressor) two hours prior to test. FEMALES- please wear underwire-free bra if available, avoid dresses & tight clothing. Wear plain shirt no beads, sparkles, rhinestones, metal or heavy embroidery.  After the Test: Drink plenty of water. After receiving IV contrast, you may experience a mild flushed feeling. This is normal. On occasion, you may experience a mild rash up to 24 hours after the test. This is not dangerous. If this occurs, you can take Benadryl 25 mg and increase your fluid intake. If you experience trouble breathing, this can be serious. If it is severe call 911 IMMEDIATELY. If it is mild, please call our office. If you take any of these medications: Glipizide/Metformin, Avandament, Glucavance, please do not take 48 hours after completing test unless otherwise instructed.  We will call to schedule your test 2-4 weeks out understanding that some insurance companies will need an authorization prior  to the service being performed.      Follow-Up: At [facility], you and your health needs are our priority.  As part of our continuing mission to provide you with exceptional heart care, we have created designated Provider Care Teams.  These Care Teams include your primary Cardiologist  (physician) and Advanced Practice Providers (APPs -  Physician Assistants and Nurse Practitioners) who all work together to provide you with the care you need, when you need it.  We recommend signing up for the patient portal called "MyChart".  Sign up information is provided on this After Visit Summary.  MyChart is used to connect with patients for Virtual Visits (Telemedicine).  Patients are able to view lab/test results, encounter notes, upcoming appointments, etc.  Non-urgent messages can be sent to your provider as well.   To learn more about what you can do with MyChart, go to [email or web address].    Your next appointment:   2 month(s)  The format for your next appointment:   In Person  Provider:   Gypsy Balsam, MD    Other Instructions Cardiac CT Angiogram A cardiac CT angiogram is a procedure to look at the heart and the area around the heart. It may be done to help find the cause of chest pains or other symptoms of heart disease. During this procedure, a substance called contrast dye is injected into the blood vessels in the area to be checked. A large X-ray machine, called a CT scanner, then takes detailed pictures of the heart and the surrounding area. The procedure is also sometimes called a coronary CT angiogram, coronary artery scanning, or CTA. A cardiac CT angiogram allows the health care provider to see how well blood is flowing to and from the heart. The health care provider will be able to see if there are any problems, such as: Blockage or narrowing of the coronary arteries in the heart. Fluid around the heart. Signs of weakness or disease in the muscles, valves, and tissues of the heart. Tell a health care provider about: Any allergies you have. This is especially important if you have had a previous allergic reaction to contrast dye. All medicines you are taking, including vitamins, herbs, eye drops, creams, and over-the-counter medicines. Any blood disorders you  have. Any surgeries you have had. Any medical conditions you have. Whether you are pregnant or may be pregnant. Any anxiety disorders, chronic pain, or other conditions you have that may increase your stress or prevent you from lying still. What are the risks? Generally, this is a safe procedure. However, problems may occur, including: Bleeding. Infection. Allergic reactions to medicines or dyes. Damage to other structures or organs. Kidney damage from the contrast dye that is used. Increased risk of cancer from radiation exposure. This risk is low. Talk with your health care provider about: The risks and benefits of testing. How you can receive the lowest dose of radiation. What happens before the procedure? Wear comfortable clothing and remove any jewelry, glasses, dentures, and hearing aids. Follow instructions from your health care provider about eating and drinking. This may include: For 12 hours before the procedure -- avoid caffeine. This includes tea, coffee, soda, energy drinks, and diet pills. Drink plenty of water or other fluids that do not have caffeine in them. Being well hydrated can prevent complications. For 4-6 hours before the procedure -- stop eating and drinking. The contrast dye can cause nausea, but this is less likely if your stomach is empty. Ask your  health care provider about changing or stopping your regular medicines. This is especially important if you are taking diabetes medicines, blood thinners, or medicines to treat problems with erections (erectile dysfunction). What happens during the procedure?  Hair on your chest may need to be removed so that small sticky patches called electrodes can be placed on your chest. These will transmit information that helps to monitor your heart during the procedure. An IV will be inserted into one of your veins. You might be given a medicine to control your heart rate during the procedure. This will help to ensure that good  images are obtained. You will be asked to lie on an exam table. This table will slide in and out of the CT machine during the procedure. Contrast dye will be injected into the IV. You might feel warm, or you may get a metallic taste in your mouth. You will be given a medicine called nitroglycerin. This will relax or dilate the arteries in your heart. The table that you are lying on will move into the CT machine tunnel for the scan. The person running the machine will give you instructions while the scans are being done. You may be asked to: Keep your arms above your head. Hold your breath. Stay very still, even if the table is moving. When the scanning is complete, you will be moved out of the machine. The IV will be removed. The procedure may vary among health care providers and hospitals. What can I expect after the procedure? After your procedure, it is common to have: A metallic taste in your mouth from the contrast dye. A feeling of warmth. A headache from the nitroglycerin. Follow these instructions at home: Take over-the-counter and prescription medicines only as told by your health care provider. If you are told, drink enough fluid to keep your urine pale yellow. This will help to flush the contrast dye out of your body. Most people can return to their normal activities right after the procedure. Ask your health care provider what activities are safe for you. It is up to you to get the results of your procedure. Ask your health care provider, or the department that is doing the procedure, when your results will be ready. Keep all follow-up visits as told by your health care provider. This is important. Contact a health care provider if: You have any symptoms of allergy to the contrast dye. These include: Shortness of breath. Rash or hives. A racing heartbeat. Summary A cardiac CT angiogram is a procedure to look at the heart and the area around the heart. It may be done to help  find the cause of chest pains or other symptoms of heart disease. During this procedure, a large X-ray machine, called a CT scanner, takes detailed pictures of the heart and the surrounding area after a contrast dye has been injected into blood vessels in the area. Ask your health care provider about changing or stopping your regular medicines before the procedure. This is especially important if you are taking diabetes medicines, blood thinners, or medicines to treat erectile dysfunction. If you are told, drink enough fluid to keep your urine pale yellow. This will help to flush the contrast dye out of your body. This information is not intended to replace advice given to you by your health care provider. Make sure you discuss any questions you have with your health care provider. Document Revised: 12/29/2021 Document Reviewed: 05/07/2019 Elsevier Patient Education  2023 [employer].  Important Information About Sugar

## 2024-01-07 NOTE — Telephone Encounter (Signed)
 Patient is being scheduled for CT angiogram and echo. Just FYI Colleen. See office note from 4/14. Will await results and clearance afterwards.

## 2024-01-08 ENCOUNTER — Telehealth: Payer: Self-pay | Admitting: Cardiology

## 2024-01-08 NOTE — Telephone Encounter (Signed)
 Patient calling in with concern about having to take the pill for her CT procedure. Please advise

## 2024-01-08 NOTE — Telephone Encounter (Signed)
 Spoke with pt and advised that the heart rate must be 55-60 for the test. Advised that she can go to the site 2 hours before and take the medication or have her dgt to come earlier and be with her. Pt verbalized understanding and had no additional questions.

## 2024-01-10 ENCOUNTER — Ambulatory Visit (HOSPITAL_BASED_OUTPATIENT_CLINIC_OR_DEPARTMENT_OTHER)
Admission: RE | Admit: 2024-01-10 | Discharge: 2024-01-10 | Disposition: A | Discharge status: Home / Self Care | Source: Ambulatory Visit | Attending: Nurse Practitioner | Admitting: Radiology

## 2024-01-10 DIAGNOSIS — R1310 Dysphagia, unspecified: Secondary | ICD-10-CM | POA: Diagnosis not present

## 2024-01-10 DIAGNOSIS — R634 Abnormal weight loss: Secondary | ICD-10-CM | POA: Diagnosis not present

## 2024-01-10 DIAGNOSIS — K5900 Constipation, unspecified: Secondary | ICD-10-CM | POA: Diagnosis not present

## 2024-01-10 MED ORDER — IOHEXOL 300 MG/ML  SOLN
100.0000 mL | Freq: Once | INTRAMUSCULAR | Status: AC | PRN
  Administered 2024-01-10: 50 mL via INTRAVENOUS

## 2024-01-10 NOTE — Telephone Encounter (Signed)
 Noted, appreciate the follow-up. Patient has a follow-up appoint with cardiology on 01/22/2024. Await cardiac clearance.

## 2024-01-22 ENCOUNTER — Ambulatory Visit: Attending: Cardiology

## 2024-01-22 ENCOUNTER — Telehealth: Payer: Self-pay | Admitting: Cardiology

## 2024-01-22 ENCOUNTER — Telehealth: Payer: Self-pay

## 2024-01-22 DIAGNOSIS — R0609 Other forms of dyspnea: Secondary | ICD-10-CM | POA: Diagnosis not present

## 2024-01-22 DIAGNOSIS — I340 Nonrheumatic mitral (valve) insufficiency: Secondary | ICD-10-CM

## 2024-01-22 LAB — ECHOCARDIOGRAM COMPLETE
Area-P 1/2: 4.83 cm2
S' Lateral: 3 cm

## 2024-01-22 NOTE — Telephone Encounter (Signed)
 Pt called stating that her CT was postponed due to the Metoprolol  causing her to "feel bad and her HR to be low". She stated that she would call back when she was ready to reschedule and her daughter is able to take her to the appt.

## 2024-01-22 NOTE — Telephone Encounter (Signed)
 See phone note from 01/22/24 from cardiology. Patient had to cancel CT at this time. Spoke with patient and informed her contact our office after she has had the CT. Then we can send a clearance to cardiology for her to have an EGD/Colon. Patient verbalized understanding. Patient states she cannot reschedule until June when her daughter can drive her to the appointment. Will await patient contact our office.

## 2024-01-22 NOTE — Telephone Encounter (Signed)
 Patient has questions about testing ordered that she was unable to complete because of medication reaction. Please call [phone]

## 2024-01-25 ENCOUNTER — Telehealth: Payer: Self-pay

## 2024-01-25 NOTE — Telephone Encounter (Signed)
 Echo Results reviewed with pt as per Dr. Vanetta Shawl note.  Pt verbalized understanding and had no additional questions. Routed to PCP

## 2024-02-08 DIAGNOSIS — R000 Tachycardia, unspecified: Secondary | ICD-10-CM | POA: Diagnosis not present

## 2024-02-08 DIAGNOSIS — I2510 Atherosclerotic heart disease of native coronary artery without angina pectoris: Secondary | ICD-10-CM | POA: Diagnosis not present

## 2024-02-08 DIAGNOSIS — J9601 Acute respiratory failure with hypoxia: Secondary | ICD-10-CM | POA: Diagnosis not present

## 2024-02-08 DIAGNOSIS — I214 Non-ST elevation (NSTEMI) myocardial infarction: Secondary | ICD-10-CM | POA: Diagnosis not present

## 2024-02-08 DIAGNOSIS — J441 Chronic obstructive pulmonary disease with (acute) exacerbation: Secondary | ICD-10-CM | POA: Diagnosis not present

## 2024-02-09 DIAGNOSIS — R7989 Other specified abnormal findings of blood chemistry: Secondary | ICD-10-CM | POA: Diagnosis not present

## 2024-02-09 DIAGNOSIS — R64 Cachexia: Secondary | ICD-10-CM | POA: Diagnosis not present

## 2024-02-09 DIAGNOSIS — I2510 Atherosclerotic heart disease of native coronary artery without angina pectoris: Secondary | ICD-10-CM | POA: Diagnosis not present

## 2024-02-10 DIAGNOSIS — R7989 Other specified abnormal findings of blood chemistry: Secondary | ICD-10-CM | POA: Diagnosis not present

## 2024-02-10 DIAGNOSIS — I2510 Atherosclerotic heart disease of native coronary artery without angina pectoris: Secondary | ICD-10-CM | POA: Diagnosis not present

## 2024-02-10 DIAGNOSIS — R64 Cachexia: Secondary | ICD-10-CM | POA: Diagnosis not present

## 2024-02-15 DIAGNOSIS — R0602 Shortness of breath: Secondary | ICD-10-CM | POA: Diagnosis not present

## 2024-02-27 ENCOUNTER — Telehealth: Payer: Self-pay

## 2024-02-27 NOTE — Telephone Encounter (Signed)
 Copied from CRM [id number]. Topic: General - Other >> Feb 27, 2024 12:27 PM Tianna S wrote: Reason for CRM: patient is calling to let dr Marygrace Snellen know that she was in the [facility], her information is uploaded to her media. She wanted to have this reviewed and sent to dr. Wallis Gun.  ATC pt-call was answered and then disconnected.   Routing to Dr. Marygrace Snellen to make aware of recent admission.

## 2024-03-07 ENCOUNTER — Telehealth (HOSPITAL_BASED_OUTPATIENT_CLINIC_OR_DEPARTMENT_OTHER): Payer: Self-pay

## 2024-03-07 NOTE — Telephone Encounter (Signed)
 Copied from CRM [id number]. Topic: Clinical - Medication Question >> Mar 06, 2024  3:46 PM Justina Oman C wrote: Reason for CRM: Patient [id number] states was in [facility] 02/07/24 through 02/16/24 had a heart attack and Dr. Gordan Latina came to see patient. Patient wants to continue medications as in the hospital, and asking if Dr. Marygrace Snellen would take over prescribing Budesonide  0.25 mg/2 mL nebuilzer solution twice daily, ALPRAZolam (XANAX) 0.25 MG tablet is for anxiety and pain maybe will continue with primary care, and Acetylcysteine-d (Mucomyst) 20% solution via every 6 hours twice daily. Patient does not think Dr. Merlyn Starring pcp will prescribe refills. Patient needs refill for Budesonide  0.25 mg/2 mL nebuilzer solution twice daily right now if Dr. Curt Dover. Patient wants to speak with nurse for further clarification. Please advise and call back.   [id number] - [place], [place] - [address] [place] [address] [phone]:[phone]

## 2024-03-10 ENCOUNTER — Ambulatory Visit: Admitting: Cardiology

## 2024-03-10 MED ORDER — BUDESONIDE 0.25 MG/2ML IN SUSP: 0.2500 mg | Freq: Two times a day (BID) | RESPIRATORY_TRACT | 12 refills | Status: AC

## 2024-03-10 NOTE — Telephone Encounter (Signed)
 Pt is aware of below message and voiced her understanding.  Budesonide  has been sent to preferred pharmacy. She is aware to d/c mucomyst.  Nothing further needed.

## 2024-03-10 NOTE — Addendum Note (Signed)
 Addended by: Katie Parks A on: 03/10/2024 10:48 AM   Modules accepted: Orders

## 2024-03-10 NOTE — Telephone Encounter (Signed)
 Please send budesonide  neb soln 0.25 mg BID, 30 day supply, refills 12. Would not continue mucomyst. Xanax to be continued or not per PCP recommendations. Thanks!

## 2024-03-12 ENCOUNTER — Ambulatory Visit: Attending: Cardiology | Admitting: Cardiology

## 2024-03-12 ENCOUNTER — Encounter: Payer: Self-pay | Admitting: Cardiology

## 2024-03-12 VITALS — BP 98/54 | HR 100 | Ht 64.0 in | Wt 91.0 lb

## 2024-03-12 DIAGNOSIS — I2781 Cor pulmonale (chronic): Secondary | ICD-10-CM

## 2024-03-12 DIAGNOSIS — Z87891 Personal history of nicotine dependence: Secondary | ICD-10-CM

## 2024-03-12 DIAGNOSIS — I671 Cerebral aneurysm, nonruptured: Secondary | ICD-10-CM

## 2024-03-12 DIAGNOSIS — J449 Chronic obstructive pulmonary disease, unspecified: Secondary | ICD-10-CM

## 2024-03-12 HISTORY — DX: Cor pulmonale (chronic): I27.81

## 2024-03-12 NOTE — Progress Notes (Signed)
 Cardiology Office Note:    Date:  03/12/2024   ID:  HIEN CUNLIFFE, DOB May 29, 1956, MRN [id number]  PCP:  Angelique Barer, MD  Cardiologist:  Ralene Schoeneck, MD    Referring MD: Angelique Barer, MD   Chief Complaint  Patient presents with   Follow-up    History of Present Illness:    Breanna Hughes is a 68 y.o. female better complex past medical history, briefly he does have advanced COPD, quit smoking a while ago, history of breast mass s/p lumpectomy x 2, cerebral aneurysm status post coil placement was in the hospital just few weeks ago because of shortness of breath.  Before that she visited my office for atypical chest pain.  The plan was to proceed with coronary CT angiogram but she presented to hospital with shortness of breath she did have Procardia/pneumonia treated appropriately saturation was very low she did have a spell of troponin at that time and thinking was that this is probably acute coronary syndrome type II.  She was managed excellently in the hospital discharged home however she comes today to months for regular follow-up.  She got quite dramatic swelling of both lower extremities legs tends tender with multiple blisters.  She said her breathing seems to be okay.  She denies have any chest pains because complaint is pain in her both legs with significant swelling.  Past Medical History:  Diagnosis Date   Anxiety state, unspecified    Arthritis    Asthma    CAD (coronary artery disease)    Chronic airway obstruction, not elsewhere classified    Colon polyps    COPD (chronic obstructive pulmonary disease) (HCC)    Depressive disorder, not elsewhere classified    Edema    Esophageal reflux    Fibromyalgia    Generalized osteoarthrosis, involving multiple sites    Hypertonicity of bladder    Insomnia, unspecified    Irritable bowel syndrome    Loss of weight    Lupus    Nausea alone    Pneumonia    Symptomatic menopausal or female climacteric states    Unspecified  constipation     Past Surgical History:  Procedure Laterality Date   ABDOMINAL HYSTERECTOMY  01/24/2016   APPENDECTOMY Left 1976   BREAST LUMPECTOMY     x 2   CEREBRAL ANEURYSM REPAIR     LUMBAR DISC SURGERY     x 3    Current Medications: Current Meds  Medication Sig   aspirin EC 81 MG tablet Take 81 mg by mouth daily. Swallow whole.   atorvastatin (LIPITOR) 40 MG tablet Take 40 mg by mouth daily.   digoxin (LANOXIN) 0.125 MG tablet Take by mouth daily.   furosemide (LASIX) 20 MG tablet Take 20 mg by mouth daily.   isosorbide mononitrate (IMDUR) 30 MG 24 hr tablet Take 30 mg by mouth daily.     Allergies:   Other, Erythromycin, Gabapentin, Nalbuphine, Nubain [nalbuphine hcl], and Prozac [fluoxetine hcl]   Social History   Socioeconomic History   Marital status: Widowed    Spouse name: Not on file   Number of children: 2   Years of education: Not on file   Highest education level: Not on file  Occupational History   Occupation: disabled  Tobacco Use   Smoking status: Former    Current packs/day: 0.00    Types: Cigarettes    Start date: 09/26/1979    Quit date: 09/25/2009    Years since quitting: 14.4  Smokeless tobacco: Never  Vaping Use   Vaping status: Not on file  Substance and Sexual Activity   Alcohol use: No    Alcohol/week: 0.0 standard drinks of alcohol    Comment: quit 2005   Drug use: No   Sexual activity: Not on file  Other Topics Concern   Not on file  Social History Narrative   Not on file   Social Drivers of Health   Financial Resource Strain: Not on file  Food Insecurity: Not on file  Transportation Needs: Not on file  Physical Activity: Not on file  Stress: Not on file  Social Connections: Not on file     Family History: The patient's family history includes Allergies in her brother and mother; Autoimmune disease in her brother; Breast cancer in her cousin and mother; Emphysema in her father; Heart disease in her brother; Rheum arthritis  in her maternal grandmother. There is no history of Liver disease, Esophageal cancer, or Colon cancer. ROS:   Please see the history of present illness.    All 14 point review of systems negative except as described per history of present illness  EKGs/Labs/Other Studies Reviewed:         Recent Labs: 12/20/2023: ALT 22; BUN 15; Creatinine, Ser 0.59; Hemoglobin 13.6; Platelets 259.0; Potassium 4.1; Sodium 140; TSH 1.94  Recent Lipid Panel No results found for: CHOL, TRIG, HDL, CHOLHDL, VLDL, LDLCALC, LDLDIRECT  Physical Exam:    VS:  BP (!) 98/54 (BP Location: Left Arm)   Pulse 100   Ht 5' 4 (1.626 m)   Wt 91 lb (41.3 kg)   SpO2 95%   BMI 15.62 kg/m     Wt Readings from Last 3 Encounters:  03/12/24 91 lb (41.3 kg)  01/07/24 84 lb (38.1 kg)  01/03/24 84 lb 6.4 oz (38.3 kg)     GEN: Cachectic looking HEENT: Normal NECK: No JVD; No carotid bruits LYMPHATICS: No lymphadenopathy CARDIAC: RRR, no murmurs, no rubs, no gallops RESPIRATORY: Poor air entry bilaterally, wearing oxygen ABDOMEN: Soft, non-tender, non-distended MUSCULOSKELETAL:  No edema; No deformity  SKIN: Warm and dry LOWER EXTREMITIES: no swelling NEUROLOGIC:  Alert and oriented x 3 PSYCHIATRIC:  Normal affect   ASSESSMENT:    1. COPD mixed type (HCC)   2. Aneurysm of middle cerebral artery   3. Cor pulmonale, chronic (HCC)   4. Former smoker    PLAN:    In order of problems listed above:  COPD seems to be decently controlled now advanced,. Aneurysm of the middle cerebral artery.  Not a concern at this point. Significant swelling of lower extremities could be related to poor lung condition cor pulmonale kind of picture, also I will rule out a DVT.  She will be transferred to the emergency room for evaluation.  I will discontinue her Cardizem and and initiate some diuretics to help with the swelling of lower extremities.  Complete metabolic panel need to be done to look at her protein which  I suspect will be low.  Overall it is quite significant deterioration of her condition since I seen her last time in the hospital.   Medication Adjustments/Labs and Tests Ordered: Current medicines are reviewed at length with the patient today.  Concerns regarding medicines are outlined above.  No orders of the defined types were placed in this encounter.  Medication changes: No orders of the defined types were placed in this encounter.   Signed, Manfred Seed, MD, [facility] 03/12/2024 1:09 PM  [facility]

## 2024-03-19 ENCOUNTER — Telehealth: Payer: Self-pay | Admitting: Cardiology

## 2024-03-19 MED ORDER — DIGOXIN 125 MCG PO TABS: 0.1250 mg | ORAL_TABLET | Freq: Every day | ORAL | 3 refills | Status: DC

## 2024-03-19 NOTE — Telephone Encounter (Signed)
*  STAT* If patient is at the pharmacy, call can be transferred to refill team.   1. Which medications need to be refilled? (please list name of each medication and dose if known) digoxin (LANOXIN) 0.125 MG tablet   2. Which pharmacy/location (including street and city if local pharmacy) is medication to be sent to? [id number] - [place], [place] - [address]    3. Do they need a 30 day or 90 day supply? 90

## 2024-03-19 NOTE — Telephone Encounter (Signed)
 RX sent to requested Pharmacy

## 2024-03-23 ENCOUNTER — Other Ambulatory Visit: Payer: Self-pay | Admitting: Cardiology

## 2024-03-24 NOTE — Telephone Encounter (Signed)
 Rx refill sent to pharmacy.

## 2024-04-02 ENCOUNTER — Other Ambulatory Visit: Payer: Self-pay | Admitting: Cardiology

## 2024-04-02 MED ORDER — ISOSORBIDE MONONITRATE ER 30 MG PO TB24: 30.0000 mg | ORAL_TABLET | Freq: Every day | ORAL | 3 refills | Status: DC

## 2024-04-02 NOTE — Telephone Encounter (Signed)
 Pt is requesting a refill on medicaiton atorvastatin. Dr. Krasowski did not prescribe this medication. Would Dr. Krasowski like to refill this medication? Please address

## 2024-04-02 NOTE — Telephone Encounter (Signed)
*  STAT* If patient is at the pharmacy, call can be transferred to refill team.   1. Which medications need to be refilled? (please list name of each medication and dose if known)  atorvastatin (LIPITOR) 40 MG tablet   isosorbide  mononitrate (IMDUR ) 30 MG 24 hr tablet   2. Which pharmacy/location (including street and city if local pharmacy) is medication to be sent to?  [id number] - [place], [place] - [address]    3. Do they need a 30 day or 90 day supply? 90

## 2024-04-16 ENCOUNTER — Encounter: Payer: Self-pay | Admitting: Pulmonary Disease

## 2024-04-16 ENCOUNTER — Ambulatory Visit: Admitting: Pulmonary Disease

## 2024-04-16 VITALS — BP 140/72 | HR 110 | Ht 64.0 in | Wt 92.0 lb

## 2024-04-16 DIAGNOSIS — J449 Chronic obstructive pulmonary disease, unspecified: Secondary | ICD-10-CM

## 2024-04-16 MED ORDER — SODIUM CHLORIDE 3 % IN NEBU: INHALATION_SOLUTION | Freq: Two times a day (BID) | RESPIRATORY_TRACT | 6 refills | Status: AC | PRN

## 2024-04-16 MED ORDER — ACETYLCYSTEINE 10 % IN SOLN
4.0000 mL | Freq: Two times a day (BID) | RESPIRATORY_TRACT | 6 refills | Status: AC | PRN
Start: 2024-04-16 — End: ?

## 2024-04-16 NOTE — Progress Notes (Signed)
 @Patient  ID: Breanna Hughes, female    DOB: 1956/02/01, 68 y.o.   MRN: [id number]  Chief Complaint  Patient presents with   Medical Management of Chronic Issues    Referring provider: Jama Chow, MD  HPI:   68 y.o. woman whom are seen for evaluation of COPD.  Most recent cardiology note reviewed.  Hospitalized x 2 in the interim.  Hospital follow-up visit.  Hospitalized in May with NSTEMI and concern for pneumonia COPD exacerbation.  Was discharged home.  Unfortunately on follow-up visit with cardiologist she had significant bilateral lower extremity swelling.  She was diagnosed with cellulitis as well.  However I have seen pictures and this looks more like venous stasis changes and significant edema more consistent with volume overload.  Unusual to have bilateral cellulitis.  Was treated with IV antibiotics as well as IV Lasix.  Swelling much better.  She continues Lasix.  She tried Breztri  in the interim but this caused thrush.  She is back on triple inhaled therapy via DuoNebs scheduled as well as budesonide .  She would like to get stronger.  She wants more rehab.  But she is running out of options after her hospitalization.  HPI at initial visit: Patient with COPD.  Present for many years.  Last seen in pulmonary office I think in 2018.  Has been off maintenance inhaler for some time now.  Has baseline dyspnea that occasionally worsens.  But she has managed for many years with use of as needed albuterol  via HFA and nebulizer.  Unfortunately things worsened over the last several months.  Sounds like viral infection.  More symptomatic.  Been using DuoNebs and Combivent now for rescue.  Using multiple times a day.  Has not been restarted on a maintenance inhaler.  She reports multiple episodes of worsening breathing throughout the year requiring prednisone.  Prednisone reliably improves things.  She had PFTs in 2015 fully interpreted below but these are consistent with moderate COPD.  Most recent  CT chest 2018 personally reviewed with impressive and significant emphysema throughout.  Questionaires / Pulmonary Flowsheets:   ACT:      No data to display          MMRC:     No data to display          Epworth:      No data to display          Tests:   FENO:  No results found for: NITRICOXIDE  PFT:    Latest Ref Rng & Units 04/07/2014    2:11 PM  PFT Results  FVC-Pre L 3.08   FVC-Predicted Pre % 91   FVC-Post L 3.10   FVC-Predicted Post % 91   Pre FEV1/FVC % % 50   Post FEV1/FCV % % 49   FEV1-Pre L 1.54   FEV1-Predicted Pre % 58   FEV1-Post L 1.52   DLCO uncorrected ml/min/mmHg 12.98   DLCO UNC% % 53   DLVA Predicted % 53   TLC L 5.73   TLC % Predicted % 113   RV % Predicted % 131   Personally viewed interpreted as moderate COPD, lung volumes with air trapping, DLCO moderately reduced  WALK:     04/07/2014    8:03 PM 04/07/2014    3:10 PM  SIX MIN WALK  Medications  Norco 7.5/500mg  and Fentanyl patch @ 10:30am  Supplimental Oxygen during Test? (L/min)  No  Laps  8   Partial Lap (in Meters)  34.5 meters  Baseline BP (sitting)  112/68  Baseline Heartrate  92  Baseline Dyspnea (Borg Scale)  1  Baseline Fatigue (Borg Scale)  4  Baseline SPO2  94 %  BP (sitting)  120/64  Heartrate  93  Dyspnea (Borg Scale)  4  Fatigue (Borg Scale)  7  SPO2  89 %  BP (sitting)  104/70  Heartrate  93  SPO2  96 %  Stopped or Paused before Six Minutes  No  Interpretation  Hip pain;Leg pain;Calf pain  Distance Completed  418.5 meters   Tech Comments:  pt c/o increased SOB at end of test--89% O2. Pt c/o calf pain.leg pain, back pain and hip pain during test. Pt wore brace on R leg during test. -- [facility]  Provider Comments: 94%, 89%, 96%, 418.5 m with complaint of leg, back and hip pains related to known arthritis. No oxygen limitation on this test      Data saved with a previous flowsheet row definition    Imaging: Personally reviewed as per EMR and  discussion in this note No results found.  Lab Results: Personally reviewed CBC    Component Value Date/Time   WBC 7.8 12/20/2023 1533   RBC 4.46 12/20/2023 1533   HGB 13.6 12/20/2023 1533   HCT 41.1 12/20/2023 1533   PLT 259.0 12/20/2023 1533   MCV 92.1 12/20/2023 1533   MCHC 33.1 12/20/2023 1533   RDW 13.0 12/20/2023 1533   LYMPHSABS 1.4 12/20/2023 1533   MONOABS 0.7 12/20/2023 1533   EOSABS 0.1 12/20/2023 1533   BASOSABS 0.0 12/20/2023 1533    BMET    Component Value Date/Time   NA 140 12/20/2023 1533   K 4.1 12/20/2023 1533   CL 100 12/20/2023 1533   CO2 33 (H) 12/20/2023 1533   GLUCOSE 104 (H) 12/20/2023 1533   BUN 15 12/20/2023 1533   CREATININE 0.59 12/20/2023 1533   CALCIUM 9.8 12/20/2023 1533   GFRNONAA >60 05/01/2007 0756   GFRAA  05/01/2007 0756    >60        The eGFR has been calculated using the MDRD equation. This calculation has not been validated in all clinical    BNP No results found for: BNP  ProBNP No results found for: PROBNP  Specialty Problems       Pulmonary Problems   COPD mixed type (HCC)   6 MWT 04/07/14- 94%, 89%, 96%, 418.5 m with complaint of leg, back and hip pains related to known arthritis. No oxygen limitation on this test PFT 04/07/14- moderately severe obstructive airways disease-emphysema, moderately severe diffusion, insignificant response to bronchodilator. FVC 3.10/91%, FEV1 1.52/58%, FEV1/FVC 0.49, FEF 25-75% 0.66, TLC 113%, RV 131%, DLCO 53%.       Active asthma   Allergic rhinitis   Chronic frontal sinusitis   Chronic maxillary sinusitis   Cough    Allergies  Allergen Reactions   Other Other (See Comments)    Darvocet-deathly sick   Erythromycin     Other Reaction(s): Anaphylaxis (disorder)   Gabapentin     Other Reaction(s): restless leg   Nalbuphine Other (See Comments)   Nubain [Nalbuphine Hcl]    Prozac [Fluoxetine Hcl] Other (See Comments)    unknown    Immunization History   Administered Date(s) Administered   Influenza Split 06/25/2013, 06/25/2014, 07/07/2015, 06/25/2016   Influenza-Unspecified 06/25/2014   Pneumococcal Polysaccharide-23 09/25/2008, 02/19/2014    Past Medical History:  Diagnosis Date   Anxiety state, unspecified    Arthritis  Asthma    CAD (coronary artery disease)    Chronic airway obstruction, not elsewhere classified    Colon polyps    COPD (chronic obstructive pulmonary disease) (HCC)    Depressive disorder, not elsewhere classified    Edema    Esophageal reflux    Fibromyalgia    Generalized osteoarthrosis, involving multiple sites    Hypertonicity of bladder    Insomnia, unspecified    Irritable bowel syndrome    Loss of weight    Lupus    Nausea alone    Pneumonia    Symptomatic menopausal or female climacteric states    Unspecified constipation     Tobacco History: Social History   Tobacco Use  Smoking Status Former   Current packs/day: 0.00   Types: Cigarettes   Start date: 09/26/1979   Quit date: 09/25/2009   Years since quitting: 14.5  Smokeless Tobacco Never   Counseling given: Not Answered   Continue to not smoke  Outpatient Encounter Medications as of 04/16/2024  Medication Sig   acetylcysteine  (MUCOMYST ) 10 % nebulizer solution Take 4 mLs by nebulization 2 (two) times daily as needed.   sodium chloride  HYPERTONIC 3 % nebulizer solution Take by nebulization 2 (two) times daily as needed for other.   ALPRAZolam (XANAX) 0.25 MG tablet Take 0.25 mg by mouth every 6 (six) hours as needed for anxiety.   aspirin EC 81 MG tablet Take 81 mg by mouth daily. Swallow whole.   atorvastatin (LIPITOR) 40 MG tablet Take 40 mg by mouth daily.   budeson-glycopyrrolate-formoterol (BREZTRI  AEROSPHERE) 160-9-4.8 MCG/ACT AERO inhaler Inhale 2 puffs into the lungs in the morning and at bedtime. (Patient not taking: Reported on 04/16/2024)   budesonide  (PULMICORT ) 0.25 MG/2ML nebulizer solution Take 2 mLs (0.25 mg total) by  nebulization 2 (two) times daily.   buprenorphine (SUBUTEX) 8 MG SUBL SL tablet Place 24 mg under the tongue daily as needed (OD).   COMBIVENT RESPIMAT 20-100 MCG/ACT AERS respimat Inhale 1 puff into the lungs every 6 (six) hours as needed for wheezing or shortness of breath.   digoxin  (LANOXIN ) 0.125 MG tablet Take 1 tablet (0.125 mg total) by mouth daily.   esomeprazole (NEXIUM) 40 MG capsule Take 40 mg by mouth daily at 12 noon.   estradiol (ESTRACE) 2 MG tablet Take 1 tablet by mouth daily. (Patient not taking: Reported on 04/16/2024)   furosemide (LASIX) 20 MG tablet Take 20 mg by mouth daily.   isosorbide  mononitrate (IMDUR ) 30 MG 24 hr tablet Take 1 tablet (30 mg total) by mouth daily.   metoprolol  tartrate (LOPRESSOR ) 100 MG tablet Take one tablet 2 hours before cardiac CT for heart greater than 55   Multiple Vitamin (MULTIVITAMIN) tablet Take 1 tablet by mouth daily.   Nebulizers (COMPRESSOR NEBULIZER) MISC 1 Device by Does not apply route once.   nitroGLYCERIN (NITROSTAT) 0.4 MG SL tablet Place 0.4 mg under the tongue every 5 (five) minutes as needed for chest pain. Reported on 12/14/2015   ondansetron (ZOFRAN-ODT) 8 MG disintegrating tablet Take 8 mg by mouth 2 (two) times daily as needed for nausea or vomiting.   traZODone (DESYREL) 50 MG tablet Take 50 mg by mouth at bedtime as needed for sleep. Take 2 tablets per night as needed   No facility-administered encounter medications on file as of 04/16/2024.     Review of Systems  Review of Systems  N/a Physical Exam  BP (!) 140/72   Pulse (!) 110   Ht 5'  4 (1.626 m)   Wt 92 lb (41.7 kg)   SpO2 91%   PF (!) 2 L/min   BMI 15.79 kg/m   Wt Readings from Last 5 Encounters:  04/16/24 92 lb (41.7 kg)  03/12/24 91 lb (41.3 kg)  01/07/24 84 lb (38.1 kg)  01/03/24 84 lb 6.4 oz (38.3 kg)  12/20/23 83 lb (37.6 kg)    BMI Readings from Last 5 Encounters:  04/16/24 15.79 kg/m  03/12/24 15.62 kg/m  01/07/24 14.42 kg/m   01/03/24 14.49 kg/m  12/20/23 14.25 kg/m     Physical Exam General: Thin, chronically ill, sitting up in chair Eyes: EOMI, icterus Neck: Supple, no JVP Pulmonary: Distant, clear Cardiovascular: Warm, no edema Abdomen: Nondistended MSK: No synovitis, no joint effusion Neuro: Normal gait, no weakness Psych: Normal mood, full affect   Assessment & Plan:   COPD with recent exacerbation: Moderate in 2015 with air trapping.  Severe and significant emphysema on prior CT scan chest.  Worsening over time symptomatically.  Not currently on maintenance inhaler.  Continuing DuoNebs and budesonide  for triple inhaled therapy.  Breztri  with too much thrush unable to tolerate.  Furl to pulmonary rehab virtually today.  Chronic hypoxemic respiratory failure: New orders for oxygen tubing with portable oxygen contour as well as home concentrator.   Return in about 3 months (around 07/17/2024) for f/u Dr. Annella via virtual visit.   Donnice JONELLE Annella, MD 04/16/2024  I spent 42 minutes in the care of the patient today in face-to-face visit review of records and coordination of care.

## 2024-04-16 NOTE — Patient Instructions (Signed)
 New order today to do pulmonary rehab at home, to continue ongoing rehabilitative efforts  Continue the budesonide  and DuoNebs  New orders today for hypertonic saline and Mucomyst  to be used as needed for airway clearance, help get rid of mucus and secretions.  These are nebulized.  Return to clinic in 3 months or sooner as needed with Dr. Annella, virtual visit please

## 2024-04-16 NOTE — Addendum Note (Signed)
 Addended byBETHA ANNELLA COUGH on: 04/16/2024 04:45 PM   Modules accepted: Level of Service

## 2024-04-17 ENCOUNTER — Telehealth: Payer: Self-pay

## 2024-04-17 NOTE — Telephone Encounter (Signed)
 Copied from CRM [id number]. Topic: Clinical - Prescription Issue >> Apr 17, 2024 12:51 PM Russell PARAS wrote: Reason for CRM:   Pt is contacting clinic to let provider know the prescribed sodium chloride  HYPERTONIC 3 % nebulizer solution is too expensive and she would like refills called in for the acetylcysteine  (MUCOMYST ) 10 % nebulizer solution.  After reviewing chart, advised the Mucomyst  had also been sent in to pharmacy on 07/23; however, she reports [facility] not [facility].  CB#  [phone]  Called patient and patient called pharmacy and everything has been resolved,they have received it and they just need to order it as it is out of stock.NFN on our end

## 2024-04-24 ENCOUNTER — Other Ambulatory Visit: Payer: Self-pay

## 2024-04-24 DIAGNOSIS — J189 Pneumonia, unspecified organism: Secondary | ICD-10-CM | POA: Insufficient documentation

## 2024-04-24 DIAGNOSIS — I2510 Atherosclerotic heart disease of native coronary artery without angina pectoris: Secondary | ICD-10-CM | POA: Insufficient documentation

## 2024-04-24 DIAGNOSIS — J449 Chronic obstructive pulmonary disease, unspecified: Secondary | ICD-10-CM | POA: Insufficient documentation

## 2024-04-24 DIAGNOSIS — M1990 Unspecified osteoarthritis, unspecified site: Secondary | ICD-10-CM | POA: Insufficient documentation

## 2024-04-24 DIAGNOSIS — N318 Other neuromuscular dysfunction of bladder: Secondary | ICD-10-CM | POA: Insufficient documentation

## 2024-04-24 DIAGNOSIS — J45909 Unspecified asthma, uncomplicated: Secondary | ICD-10-CM | POA: Insufficient documentation

## 2024-04-24 DIAGNOSIS — K589 Irritable bowel syndrome without diarrhea: Secondary | ICD-10-CM | POA: Insufficient documentation

## 2024-04-24 DIAGNOSIS — R609 Edema, unspecified: Secondary | ICD-10-CM | POA: Insufficient documentation

## 2024-04-24 DIAGNOSIS — M797 Fibromyalgia: Secondary | ICD-10-CM | POA: Insufficient documentation

## 2024-04-24 DIAGNOSIS — M159 Polyosteoarthritis, unspecified: Secondary | ICD-10-CM | POA: Insufficient documentation

## 2024-04-24 DIAGNOSIS — G4700 Insomnia, unspecified: Secondary | ICD-10-CM | POA: Insufficient documentation

## 2024-04-24 DIAGNOSIS — N951 Menopausal and female climacteric states: Secondary | ICD-10-CM | POA: Insufficient documentation

## 2024-04-24 DIAGNOSIS — K635 Polyp of colon: Secondary | ICD-10-CM | POA: Insufficient documentation

## 2024-04-25 ENCOUNTER — Ambulatory Visit: Attending: Cardiology | Admitting: Cardiology

## 2024-04-25 ENCOUNTER — Encounter: Payer: Self-pay | Admitting: Cardiology

## 2024-04-25 VITALS — BP 120/76 | HR 96 | Ht 64.0 in | Wt 94.0 lb

## 2024-04-25 DIAGNOSIS — M159 Polyosteoarthritis, unspecified: Secondary | ICD-10-CM | POA: Diagnosis not present

## 2024-04-25 DIAGNOSIS — I2781 Cor pulmonale (chronic): Secondary | ICD-10-CM | POA: Diagnosis not present

## 2024-04-25 DIAGNOSIS — I2510 Atherosclerotic heart disease of native coronary artery without angina pectoris: Secondary | ICD-10-CM

## 2024-04-25 DIAGNOSIS — J410 Simple chronic bronchitis: Secondary | ICD-10-CM

## 2024-04-25 DIAGNOSIS — R0609 Other forms of dyspnea: Secondary | ICD-10-CM

## 2024-04-25 NOTE — Patient Instructions (Addendum)
 Medication Instructions:  Your physician recommends that you continue on your current medications as directed. Please refer to the Current Medication list given to you today.  *If you need a refill on your cardiac medications before your next appointment, please call your pharmacy*   Lab Work: Your physician recommends that you have a BMP, CBC and ProBNP today in the office.  If you have labs (blood work) drawn today and your tests are completely normal, you will receive your results only by: MyChart Message (if you have MyChart) OR A paper copy in the mail If you have any lab test that is abnormal or we need to change your treatment, we will call you to review the results.   Testing/Procedures: None ordered   Follow-Up: At [facility], you and your health needs are our priority.  As part of our continuing mission to provide you with exceptional heart care, we have created designated Provider Care Teams.  These Care Teams include your primary Cardiologist (physician) and Advanced Practice Providers (APPs -  Physician Assistants and Nurse Practitioners) who all work together to provide you with the care you need, when you need it.  We recommend signing up for the patient portal called MyChart.  Sign up information is provided on this After Visit Summary.  MyChart is used to connect with patients for Virtual Visits (Telemedicine).  Patients are able to view lab/test results, encounter notes, upcoming appointments, etc.  Non-urgent messages can be sent to your provider as well.   To learn more about what you can do with MyChart, go to [email or web address].    Your next appointment:   3 month(s)  The format for your next appointment:   In Person  Provider:   Jennifer Crape, MD    Other Instructions none  Important Information About Sugar

## 2024-04-25 NOTE — Progress Notes (Signed)
 Cardiology Office Note:    Date:  04/25/2024   ID:  Breanna Hughes, DOB 05-Nov-1955, MRN [id number]  PCP:  Jama Chow, MD  Cardiologist:  Lamar Fitch, MD    Referring MD: Jama Chow, MD   No chief complaint on file.   History of Present Illness:    Breanna Hughes is a 68 y.o. female complex past medical history apparently years ago she had some stress test done and was told to have some abnormality of the heart and that is why she came to me to clarify that.  However issues much deeper.  She does have COPD quite advanced she is almost cachectic on oxygen all the time, she does have some cor pulmonale swelling of lower extremities admitted to the hospital with some degree of respiratory failure, troponin were elevated which we thought was related to exacerbation of COPD, also admission for cellulitis.  Comes today for follow-up looks pretty good.  Minimal swelling of lower extremities LME elevated worse only on 20 mg of Lasix.  She is taking digoxin  which I am not sure exactly what the purpose of this medication is we will probably discontinue that.  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 No oxygen limitation on this test  PFT 04/07/14- moderately severe obstructive airways disease-emphysema, moderately severe diffusion, insignificant response to bronchodilator. FVC 3.10/91%, FEV1 1.52/58%, FEV1/FVC 0.49,  FEF 25-75% 0.66, TLC 113%, RV 131%, DLCO 53%.        Cor pulmonale, chronic (HCC) 03/12/2024   Cough 11/02/2015   Diarrhea 12/23/2015   Drug therapy 11/02/2015   Edema    Epigastric pain 12/23/2015   Esophageal reflux    Fibromyalgia    Former smoker 11/02/2015   Generalized osteoarthrosis, involving multiple sites    H/O ulcer disease 11/02/2015   Hearing loss 11/02/2015   Hypertonicity of bladder    Insomnia, unspecified    Irritable bowel syndrome    Knee pain, bilateral 11/02/2015   Localized edema 11/02/2015   Loss of weight    Myalgia 11/02/2015   Myositis 11/02/2015   Nausea with vomiting 12/23/2015   Pain in knee joint 11/02/2015   Peripheral neuropathy 11/02/2015   Pneumonia    Proteinuria 11/02/2015   Swelling of limb 11/02/2015   Symptomatic menopausal or female climacteric states    Systemic lupus (HCC) 04/07/2014   Details not available     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   ALPRAZolam (XANAX) 0.25 MG tablet Take 0.25 mg by mouth every 6 (six) hours as needed for anxiety.   amoxicillin-clavulanate (AUGMENTIN) 875-125 MG tablet Take 1 tablet by mouth 2 (two) times daily.   aspirin EC 81 MG tablet Take 81 mg by mouth daily. Swallow whole.   atorvastatin (LIPITOR) 40 MG tablet Take 40 mg by mouth daily.   budeson-glycopyrrolate-formoterol (BREZTRI  AEROSPHERE) 160-9-4.8 MCG/ACT AERO inhaler Inhale 2 puffs into the lungs in the morning and at bedtime.   budesonide  (PULMICORT ) 0.25 MG/2ML nebulizer solution Take 2 mLs (0.25 mg total) by nebulization 2 (two) times daily.    buprenorphine (SUBUTEX) 8 MG SUBL SL tablet Place 24 mg under the tongue daily as needed (OD).   COMBIVENT RESPIMAT 20-100 MCG/ACT AERS respimat Inhale 1 puff into the lungs every 6 (six) hours as needed for wheezing or shortness of breath.   digoxin  (LANOXIN ) 0.125 MG tablet Take 1 tablet (0.125 mg total) by mouth daily.   esomeprazole (NEXIUM) 40 MG capsule Take 40 mg by mouth daily at 12 noon.   ferrous sulfate 324 (65 Fe) MG TBEC Take 1 tablet by mouth daily.   furosemide (LASIX) 20 MG tablet Take 20 mg by mouth daily.   isosorbide  mononitrate (IMDUR ) 30 MG 24 hr tablet Take 1 tablet (30 mg total) by mouth daily.   Multiple Vitamin (MULTIVITAMIN) tablet Take 1 tablet by mouth daily.   Nebulizers (COMPRESSOR NEBULIZER) MISC 1 Device by Does not apply route once.   nicotine (NICODERM CQ - DOSED IN MG/24 HOURS) 21 mg/24hr patch Place 21 mg onto the skin daily.   nitroGLYCERIN (NITROSTAT) 0.4 MG SL tablet Place 0.4 mg under the tongue every 5 (five) minutes as needed for chest pain. Reported on 12/14/2015   ondansetron (ZOFRAN-ODT) 8 MG disintegrating tablet Take 8 mg by mouth 2 (two) times daily as needed for nausea or vomiting.   sodium chloride  HYPERTONIC 3 % nebulizer solution Take by nebulization 2 (two) times daily as needed for other.   traZODone (DESYREL) 50 MG tablet Take 50 mg by mouth at bedtime as needed for sleep. Take 2 tablets per night as needed   [DISCONTINUED] metoprolol  tartrate (LOPRESSOR ) 100 MG tablet Take one tablet 2 hours before cardiac CT for heart greater than 55     Allergies:   Other, Erythromycin, Gabapentin, Nalbuphine, Nubain [nalbuphine hcl], and Prozac [fluoxetine hcl]   Social History   Socioeconomic History   Marital status: Widowed    Spouse name: Not on file   Number of children: 2   Years of education: Not on file   Highest education level: Not on file  Occupational History   Occupation: disabled  Tobacco Use   Smoking status: Former    Current  packs/day: 0.00    Types: Cigarettes    Start date: 09/26/1979    Quit date: 09/25/2009    Years since quitting: 14.5   Smokeless tobacco: Never  Vaping Use   Vaping status: Not on file  Substance and Sexual Activity   Alcohol use: No    Alcohol/week: 0.0 standard drinks of alcohol    Comment: quit 2005   Drug use: No   Sexual activity: Not on file  Other Topics Concern   Not on file  Social History Narrative   Not on file   Social Drivers of Health   Financial Resource Strain: Not on file  Food Insecurity: Not on file  Transportation Needs: Not on  file  Physical Activity: Not on file  Stress: Not on file  Social Connections: Not on file     Family History: The patient's family history includes Allergies in her brother and mother; Autoimmune disease in her brother; Breast cancer in her cousin and mother; Emphysema in her father; Heart disease in her brother; Rheum arthritis in her maternal grandmother. There is no history of Liver disease, Esophageal cancer, or Colon cancer. ROS:   Please see the history of present illness.    All 14 point review of systems negative except as described per history of present illness  EKGs/Labs/Other Studies Reviewed:         Recent Labs: 12/20/2023: ALT 22; BUN 15; Creatinine, Ser 0.59; Hemoglobin 13.6; Platelets 259.0; Potassium 4.1; Sodium 140; TSH 1.94  Recent Lipid Panel No results found for: CHOL, TRIG, HDL, CHOLHDL, VLDL, LDLCALC, LDLDIRECT  Physical Exam:    VS:  BP 120/76   Pulse 96   Ht 5' 4 (1.626 m)   Wt 94 lb (42.6 kg)   SpO2 93%   BMI 16.14 kg/m     Wt Readings from Last 3 Encounters:  04/25/24 94 lb (42.6 kg)  04/16/24 92 lb (41.7 kg)  03/12/24 91 lb (41.3 kg)     GEN:  Well nourished, well developed in no acute distress, almost cachectic looking HEENT: Normal NECK: No JVD; No carotid bruits LYMPHATICS: No lymphadenopathy CARDIAC: RRR, no murmurs, no rubs, no gallops RESPIRATORY:  Clear to  auscultation without rales, wheezing or rhonchi poor bilaterally ABDOMEN: Soft, non-tender, non-distended MUSCULOSKELETAL:  No edema; No deformity  SKIN: Warm and dry LOWER EXTREMITIES: no swelling NEUROLOGIC:  Alert and oriented x 3 PSYCHIATRIC:  Normal affect   ASSESSMENT:    1. Coronary artery disease involving native coronary artery of native heart without angina pectoris   2. Simple chronic bronchitis (HCC)   3. Generalized osteoarthrosis, involving multiple sites   4. Cor pulmonale, chronic (HCC)    PLAN:    In order of problems listed above:  CAD initial intention was to pursue coronary CT angio but overall she deteriorated from respiratory point of view and I think the best approach is medical therapy.  She is on aspirin and statin which I continue denies have any chest pain tightness squeezing pressure burning chest. COPD.  Advanced chronic noted follow-up by internal medicine team. Osteoporosis.  Noted. Called pulmonary which is a leading cardiac issue that she is facing right now.  She is on diuretic I will check proBNP Chem-7 and CBC today.  Will see her back in about 3 months   Medication Adjustments/Labs and Tests Ordered: Current medicines are reviewed at length with the patient today.  Concerns regarding medicines are outlined above.  No orders of the defined types were placed in this encounter.  Medication changes: No orders of the defined types were placed in this encounter.   Signed, Lamar DOROTHA Fitch, MD, [facility] 04/25/2024 8:22 AM     [facility]

## 2024-04-26 LAB — BASIC METABOLIC PANEL WITH GFR
BUN/Creatinine Ratio: 21 (ref 12–28)
BUN: 13 mg/dL (ref 8–27)
CO2: 36 mmol/L — ABNORMAL HIGH (ref 20–29)
Calcium: 9 mg/dL (ref 8.7–10.3)
Chloride: 96 mmol/L (ref 96–106)
Creatinine, Ser: 0.61 mg/dL (ref 0.57–1.00)
Glucose: 98 mg/dL (ref 70–99)
Potassium: 4.7 mmol/L (ref 3.5–5.2)
Sodium: 142 mmol/L (ref 134–144)
eGFR: 98 mL/min/1.73 (ref >59)

## 2024-04-26 LAB — CBC
Hematocrit: 38.3 % (ref 34.0–46.6)
Hemoglobin: 11.5 g/dL (ref 11.1–15.9)
MCH: 29.4 pg (ref 26.6–33.0)
MCHC: 30 g/dL — ABNORMAL LOW (ref 31.5–35.7)
MCV: 98 fL — ABNORMAL HIGH (ref 79–97)
Platelets: 265 x10E3/uL (ref 150–450)
RBC: 3.91 x10E6/uL (ref 3.77–5.28)
RDW: 12.7 % (ref 11.7–15.4)
WBC: 6.1 x10E3/uL (ref 3.4–10.8)

## 2024-04-26 LAB — PRO B NATRIURETIC PEPTIDE: NT-Pro BNP: 256 pg/mL (ref 0–301)

## 2024-05-08 ENCOUNTER — Telehealth: Payer: Self-pay

## 2024-05-08 ENCOUNTER — Ambulatory Visit: Payer: Self-pay | Admitting: Cardiology

## 2024-05-08 NOTE — Telephone Encounter (Signed)
 Left message on My Chart with lab results per Dr. Karry note. Routed to PCP

## 2024-05-08 NOTE — Telephone Encounter (Signed)
Pt viewed results on My Chart per Dr. Krasowski's note. Routed to PCP.  

## 2024-05-08 NOTE — Telephone Encounter (Signed)
 Left message on My Chart with normal results per Dr. Karry note. Routed to PCP.

## 2024-06-17 ENCOUNTER — Other Ambulatory Visit: Payer: Self-pay | Admitting: Pulmonary Disease

## 2024-06-17 NOTE — Telephone Encounter (Signed)
 Pharmacy comment: Alternative Requested:THIS IS ON BACKORDER.  Dr Annella, Please advise what you would advise next for the RX acetylcysteine  (MUCOMYST ) 10 % nebulizer solution

## 2024-06-17 NOTE — Telephone Encounter (Signed)
There is no alternative to this medication

## 2024-07-24 ENCOUNTER — Encounter: Payer: Self-pay | Admitting: Pulmonary Disease

## 2024-07-24 ENCOUNTER — Telehealth: Admitting: Pulmonary Disease

## 2024-07-24 DIAGNOSIS — J441 Chronic obstructive pulmonary disease with (acute) exacerbation: Secondary | ICD-10-CM | POA: Diagnosis not present

## 2024-07-24 DIAGNOSIS — M7989 Other specified soft tissue disorders: Secondary | ICD-10-CM | POA: Diagnosis not present

## 2024-07-24 DIAGNOSIS — J410 Simple chronic bronchitis: Secondary | ICD-10-CM

## 2024-07-24 NOTE — Progress Notes (Signed)
 @Patient  ID: Breanna Hughes, female    DOB: [birth year], 68 y.o.   MRN: [id number]  Chief Complaint  Patient presents with   Medical Management of Chronic Issues    PT states while laying down feels swelled up inside the chest area / sputum brown / feet ankle and legs     Referring provider: Jama Chow, MD  HPI:   68 y.o. woman whom are seen for evaluation of COPD.  Most recent cardiology note reviewed.    Today, she complains of worsening congestion and shortness of breath over the last several days.  Coughing up more phlegm.  Was prescribed Augmentin by PCP.  No improvement.  Went back yesterday and clindamycin was given.  Also endorses worsening swelling in her legs and shortness of breath when she lies down.  Discussed concern for CHF exacerbation, fluid buildup.  No increase in Lasix or change in diuretics was made by her PCP despite these symptoms being reported at recent office visits.  Discussed with her concern for COPD exacerbation given ongoing phlegm, worsened cough, shortness of breath.  Discussed concern for fluid buildup and need for altering diuretic regimen.  HPI at initial visit: Patient with COPD.  Present for many years.  Last seen in pulmonary office I think in 2018.  Has been off maintenance inhaler for some time now.  Has baseline dyspnea that occasionally worsens.  But she has managed for many years with use of as needed albuterol  via HFA and nebulizer.  Unfortunately things worsened over the last several months.  Sounds like viral infection.  More symptomatic.  Been using DuoNebs and Combivent now for rescue.  Using multiple times a day.  Has not been restarted on a maintenance inhaler.  She reports multiple episodes of worsening breathing throughout the year requiring prednisone.  Prednisone reliably improves things.  She had PFTs in 2015 fully interpreted below but these are consistent with moderate COPD.  Most recent CT chest 2018 personally reviewed with impressive and  significant emphysema throughout.  Questionaires / Pulmonary Flowsheets:   ACT:      No data to display          MMRC:     No data to display          Epworth:      No data to display          Tests:   FENO:  No results found for: NITRICOXIDE  PFT:    Latest Ref Rng & Units 04/07/2014    2:11 PM  PFT Results  FVC-Pre L 3.08   FVC-Predicted Pre % 91   FVC-Post L 3.10   FVC-Predicted Post % 91   Pre FEV1/FVC % % 50   Post FEV1/FCV % % 49   FEV1-Pre L 1.54   FEV1-Predicted Pre % 58   FEV1-Post L 1.52   DLCO uncorrected ml/min/mmHg 12.98   DLCO UNC% % 53   DLVA Predicted % 53   TLC L 5.73   TLC % Predicted % 113   RV % Predicted % 131   Personally viewed interpreted as moderate COPD, lung volumes with air trapping, DLCO moderately reduced  WALK:     04/07/2014    8:03 PM 04/07/2014    3:10 PM  SIX MIN WALK  Medications  Norco 7.5/500mg  and Fentanyl patch @ 10:30am  Supplimental Oxygen during Test? (L/min)  No  Laps  8   Partial Lap (in Meters)  34.5 meters  Baseline BP (sitting)  112/68  Baseline Heartrate  92  Baseline Dyspnea (Borg Scale)  1  Baseline Fatigue (Borg Scale)  4  Baseline SPO2  94 %  BP (sitting)  120/64  Heartrate  93  Dyspnea (Borg Scale)  4  Fatigue (Borg Scale)  7  SPO2  89 %  BP (sitting)  104/70  Heartrate  93  SPO2  96 %  Stopped or Paused before Six Minutes  No  Interpretation  Hip pain;Leg pain;Calf pain  Distance Completed  418.5 meters   Tech Comments:  pt c/o increased SOB at end of test--89% O2. Pt c/o calf pain.leg pain, back pain and hip pain during test. Pt wore brace on R leg during test. -- [facility]  Provider Comments: 94%, 89%, 96%, 418.5 m with complaint of leg, back and hip pains related to known arthritis. No oxygen limitation on this test      Data saved with a previous flowsheet row definition    Imaging: Personally reviewed as per EMR and discussion in this note No results found.  Lab  Results: Personally reviewed CBC    Component Value Date/Time   WBC 6.1 04/25/2024 0834   WBC 7.8 12/20/2023 1533   RBC 3.91 04/25/2024 0834   RBC 4.46 12/20/2023 1533   HGB 11.5 04/25/2024 0834   HCT 38.3 04/25/2024 0834   PLT 265 04/25/2024 0834   MCV 98 (H) 04/25/2024 0834   MCH 29.4 04/25/2024 0834   MCHC 30.0 (L) 04/25/2024 0834   MCHC 33.1 12/20/2023 1533   RDW 12.7 04/25/2024 0834   LYMPHSABS 1.4 12/20/2023 1533   MONOABS 0.7 12/20/2023 1533   EOSABS 0.1 12/20/2023 1533   BASOSABS 0.0 12/20/2023 1533    BMET    Component Value Date/Time   NA 142 04/25/2024 0834   K 4.7 04/25/2024 0834   CL 96 04/25/2024 0834   CO2 36 (H) 04/25/2024 0834   GLUCOSE 98 04/25/2024 0834   GLUCOSE 104 (H) 12/20/2023 1533   BUN 13 04/25/2024 0834   CREATININE 0.61 04/25/2024 0834   CALCIUM 9.0 04/25/2024 0834   GFRNONAA >60 05/01/2007 0756   GFRAA  05/01/2007 0756    >60        The eGFR has been calculated using the MDRD equation. This calculation has not been validated in all clinical    BNP No results found for: BNP  ProBNP    Component Value Date/Time   PROBNP 256 04/25/2024 0834    Specialty Problems       Pulmonary Problems   COPD mixed type (HCC)   6 MWT 04/07/14- 94%, 89%, 96%, 418.5 m with complaint of leg, back and hip pains related to known arthritis. No oxygen limitation on this test PFT 04/07/14- moderately severe obstructive airways disease-emphysema, moderately severe diffusion, insignificant response to bronchodilator. FVC 3.10/91%, FEV1 1.52/58%, FEV1/FVC 0.49, FEF 25-75% 0.66, TLC 113%, RV 131%, DLCO 53%.       Active asthma   Allergic rhinitis   Chronic frontal sinusitis   Chronic maxillary sinusitis   Cough   Asthma   COPD (chronic obstructive pulmonary disease) (HCC)   Pneumonia    Allergies  Allergen Reactions   Other Other (See Comments)    Darvocet-deathly sick   Erythromycin     Anaphylaxis (disorder)   Gabapentin     restless  leg   Nalbuphine Other (See Comments)   Nubain [Nalbuphine Hcl]    Prozac [Fluoxetine Hcl] Other (See Comments)    unknown  Immunization History  Administered Date(s) Administered   Influenza Split 06/25/2013, 06/25/2014, 07/07/2015, 06/25/2016   Influenza-Unspecified 06/25/2014   Pneumococcal Polysaccharide-23 09/25/2008, 02/19/2014    Past Medical History:  Diagnosis Date   Active asthma 11/02/2015   Allergic rhinitis 11/02/2015   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 No oxygen limitation on this test  PFT 04/07/14- moderately severe obstructive airways disease-emphysema, moderately severe diffusion, insignificant response to bronchodilator. FVC 3.10/91%, FEV1 1.52/58%, FEV1/FVC 0.49, FEF 25-75% 0.66, TLC 113%, RV 131%, DLCO 53%.        Cor pulmonale, chronic (HCC) 03/12/2024   Cough 11/02/2015   Diarrhea 12/23/2015   Drug therapy 11/02/2015   Edema    Epigastric pain 12/23/2015   Esophageal reflux    Fibromyalgia    Former smoker 11/02/2015   Generalized osteoarthrosis, involving multiple sites    H/O ulcer disease 11/02/2015   Hearing loss 11/02/2015   Hypertonicity of bladder    Insomnia, unspecified    Irritable bowel syndrome    Knee pain, bilateral 11/02/2015   Localized edema 11/02/2015   Loss of weight    Myalgia 11/02/2015   Myositis 11/02/2015   Nausea with vomiting 12/23/2015   Pain in knee joint 11/02/2015   Peripheral neuropathy 11/02/2015   Pneumonia    Proteinuria 11/02/2015   Swelling of limb 11/02/2015   Symptomatic menopausal or female climacteric states     Systemic lupus (HCC) 04/07/2014   Details not available     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   ALPRAZolam (XANAX) 0.25 MG tablet Take 0.25 mg by mouth every 6 (six) hours as needed for anxiety.   amoxicillin-clavulanate (AUGMENTIN) 875-125 MG tablet Take 1 tablet by mouth 2 (two) times daily.   aspirin EC 81 MG tablet Take 81 mg by mouth daily. Swallow whole.   atorvastatin (LIPITOR) 40 MG tablet Take 40 mg by mouth daily.   budesonide  (PULMICORT ) 0.25 MG/2ML nebulizer solution Take 2 mLs (0.25 mg total) by nebulization 2 (two) times daily.   buprenorphine (SUBUTEX) 8 MG SUBL SL tablet Place 24 mg under the tongue daily as needed (OD).   clindamycin (CLEOCIN) 150 MG capsule Take 150 mg by mouth 3 (three) times daily.   COMBIVENT RESPIMAT 20-100 MCG/ACT AERS respimat Inhale 1 puff into the lungs every 6 (six) hours as needed for wheezing or shortness of breath.   digoxin  (LANOXIN ) 0.125 MG tablet Take 1 tablet (0.125 mg total) by mouth daily.   diltiazem (CARDIZEM) 30 MG tablet Take 30 mg by mouth 2 (two) times daily.  esomeprazole (NEXIUM) 40 MG capsule Take 40 mg by mouth daily at 12 noon.   ferrous sulfate 324 (65 Fe) MG TBEC Take 1 tablet by mouth daily.   furosemide (LASIX) 20 MG tablet Take 20 mg by mouth daily.   isosorbide  mononitrate (IMDUR ) 30 MG 24 hr tablet Take 1 tablet (30 mg total) by mouth daily.   Multiple Vitamin (MULTIVITAMIN) tablet Take 1 tablet by mouth daily.   Nebulizers (COMPRESSOR  NEBULIZER) MISC 1 Device by Does not apply route once.   nicotine (NICODERM CQ - DOSED IN MG/24 HOURS) 21 mg/24hr patch Place 21 mg onto the skin daily.   nitroGLYCERIN (NITROSTAT) 0.4 MG SL tablet Place 0.4 mg under the tongue every 5 (five) minutes as needed for chest pain. Reported on 12/14/2015   ondansetron (ZOFRAN-ODT) 8 MG disintegrating tablet Take 8 mg by mouth 2 (two) times daily as needed for nausea or vomiting.   budeson-glycopyrrolate-formoterol (BREZTRI  AEROSPHERE) 160-9-4.8 MCG/ACT AERO inhaler Inhale 2 puffs into the lungs in the morning and at bedtime.   sodium chloride  HYPERTONIC 3 % nebulizer solution Take by nebulization 2 (two) times daily as needed for other.   traZODone (DESYREL) 50 MG tablet Take 50 mg by mouth at bedtime as needed for sleep. Take 2 tablets per night as needed (Patient not taking: Reported on 07/24/2024)   No facility-administered encounter medications on file as of 07/24/2024.     Review of Systems  Review of Systems  N/a Physical Exam  There were no vitals taken for this visit.  Wt Readings from Last 5 Encounters:  04/25/24 94 lb (42.6 kg)  04/16/24 92 lb (41.7 kg)  03/12/24 91 lb (41.3 kg)  01/07/24 84 lb (38.1 kg)  01/03/24 84 lb 6.4 oz (38.3 kg)    BMI Readings from Last 5 Encounters:  04/25/24 16.14 kg/m  04/16/24 15.79 kg/m  03/12/24 15.62 kg/m  01/07/24 14.42 kg/m  01/03/24 14.49 kg/m     Physical Exam General: Thin, chronically ill, sitting up on couch Eyes: No icterus Pulmonary: Normal work of breathing, speaking in full sentences, no conversational dyspnea, on nasal cannula  Assessment & Plan:   COPD with exacerbation: Couple weeks of increased cough congestion shortness of breath.  No improvement Augmentin.  On the mycin prescribed by PCP yesterday.  Encouraged her to start this.  Prednisone taper sent.  Continue Breztri , continue short acting bronchodilators as needed, continue budesonide  nebulizer at  night.  Lower extremity swelling: Associated equivocal orthopnea.  Increase Lasix dose to 40 mg daily for 3 days.  Then return to 20 mg daily.  If no significant improvement, encouraged her to discuss with her PCP and cardiologist.   Return in about 6 months (around 01/22/2025) for f/u Dr. Annella.   Donnice JONELLE Annella, MD 07/24/2024  Virtual Visit via Video Note  I connected with Breanna Hughes on 07/24/24 at  3:30 PM EDT by a video enabled telemedicine application and verified that I am speaking with the correct person using two identifiers.  Location: Patient: Home Provider: Office - [facility] - [address], [address], [place], [place] [address]  I discussed the limitations of evaluation and management by telemedicine and the availability of in person appointments. The patient expressed understanding and agreed to proceed. I also discussed with the patient that there may be a patient responsible charge related to this service. The patient expressed understanding and agreed to proceed.  Patient consented to consult via telephone: Yes People present and their  role in pt care: Pt

## 2024-07-25 ENCOUNTER — Telehealth: Payer: Self-pay

## 2024-07-25 NOTE — Telephone Encounter (Signed)
 Copied from CRM #[id number]. Topic: Clinical - Prescription Issue >> Jul 24, 2024  5:31 PM Rozanna MATSU wrote: Reason for CRM: pt calling back to check and see if Dr [facility] sent her prednisone prescription advised her he was still working on her notes from her video appt this afternoon, but it should be updated soon.

## 2024-07-25 NOTE — Telephone Encounter (Signed)
 Dr. Neda, The prednisone for this patient was not sent into the pharmacy and Dr. Annella is on vacation and we are unable to reach him.  There is nothing is is progress notes to indicate the dose of the taper.  Can you please send in prednisone for this patient?  Thank you.

## 2024-07-28 ENCOUNTER — Telehealth: Payer: Self-pay

## 2024-07-28 MED ORDER — PREDNISONE 20 MG PO TABS: ORAL_TABLET | ORAL | 0 refills | Status: AC

## 2024-07-28 NOTE — Telephone Encounter (Signed)
 I called and spoke with patient, advised that prednisone taper had been sent in and apologized for the delay.  She stated she just received a message from [facility] pharmacy that it was ready.  Nothing further needed.

## 2024-07-28 NOTE — Telephone Encounter (Signed)
 Checked in with patient Breanna Hughes. She has seen that her Rx has been sent to Pharmacy

## 2024-07-28 NOTE — Telephone Encounter (Signed)
 Pred taper sent

## 2024-07-30 ENCOUNTER — Ambulatory Visit: Admitting: Cardiology

## 2024-08-06 ENCOUNTER — Encounter: Payer: Self-pay | Admitting: Cardiology

## 2024-08-06 ENCOUNTER — Ambulatory Visit: Attending: Cardiology | Admitting: Cardiology

## 2024-08-06 VITALS — BP 120/68 | HR 106 | Ht 64.0 in | Wt 96.0 lb

## 2024-08-06 DIAGNOSIS — I2781 Cor pulmonale (chronic): Secondary | ICD-10-CM

## 2024-08-06 DIAGNOSIS — J410 Simple chronic bronchitis: Secondary | ICD-10-CM | POA: Diagnosis not present

## 2024-08-06 DIAGNOSIS — I2510 Atherosclerotic heart disease of native coronary artery without angina pectoris: Secondary | ICD-10-CM | POA: Diagnosis not present

## 2024-08-06 MED ORDER — ISOSORBIDE MONONITRATE ER 60 MG PO TB24
60.0000 mg | ORAL_TABLET | Freq: Every day | ORAL | 3 refills | Status: AC
Start: 2024-08-06 — End: ?

## 2024-08-06 NOTE — Progress Notes (Signed)
 Cardiology Office Note:    Date:  08/06/2024   ID:  Breanna Hughes, DOB 1956-08-10, MRN [id number]  PCP:  Jama Chow, MD  Cardiologist:  Lamar Fitch, MD    Referring MD: Jama Chow, MD   Chief Complaint  Patient presents with   Follow-up  Doing fine  History of Present Illness:    Breanna Hughes is a 68 y.o. female past medical history significant for COPD which is advanced, she is cachectic looking on oxygen all the time, cor pulmonale, swelling of lower extremities now under control, she complained of having some chest pain plan were to do coronary scans but because of her overall poor condition we decided not to pursue it.  Comes today to months for follow-up she looks actually pretty good.  She gained a few pounds which she is feeling better.  No swelling of lower extremities no palpitations she is concerned about her heart rate being [place] elevated she says she is very anxious about.  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 No oxygen limitation on this test  PFT 04/07/14- moderately severe obstructive airways disease-emphysema, moderately severe diffusion, insignificant response to bronchodilator. FVC 3.10/91%, FEV1 1.52/58%, FEV1/FVC 0.49, FEF 25-75% 0.66, TLC 113%, RV 131%, DLCO 53%.        Cor pulmonale, chronic (HCC) 03/12/2024   Cough 11/02/2015   Diarrhea 12/23/2015   Drug therapy 11/02/2015   Edema    Epigastric pain 12/23/2015   Esophageal reflux    Fibromyalgia    Former  smoker 11/02/2015   Generalized osteoarthrosis, involving multiple sites    H/O ulcer disease 11/02/2015   Hearing loss 11/02/2015   Hypertonicity of bladder    Insomnia, unspecified    Irritable bowel syndrome    Knee pain, bilateral 11/02/2015   Localized edema 11/02/2015   Loss of weight    Myalgia 11/02/2015   Myositis 11/02/2015   Nausea with vomiting 12/23/2015   Pain in knee joint 11/02/2015   Peripheral neuropathy 11/02/2015   Pneumonia    Proteinuria 11/02/2015   Swelling of limb 11/02/2015   Symptomatic menopausal or female climacteric states    Systemic lupus (HCC) 04/07/2014   Details not available     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   ALPRAZolam (XANAX) 0.25 MG tablet Take 0.25 mg by mouth every 6 (six) hours as needed for anxiety.   aspirin EC 81 MG tablet Take 81 mg by mouth daily. Swallow whole.   atorvastatin (LIPITOR)  40 MG tablet Take 40 mg by mouth daily.   budesonide  (PULMICORT ) 0.25 MG/2ML nebulizer solution Take 2 mLs (0.25 mg total) by nebulization 2 (two) times daily.   buprenorphine (SUBUTEX) 8 MG SUBL SL tablet Place 24 mg under the tongue daily as needed (OD).   clindamycin (CLEOCIN) 150 MG capsule Take 150 mg by mouth 3 (three) times daily.   COMBIVENT RESPIMAT 20-100 MCG/ACT AERS respimat Inhale 1 puff into the lungs every 6 (six) hours as needed for wheezing or shortness of breath.   digoxin  (LANOXIN ) 0.125 MG tablet Take 1 tablet (0.125 mg total) by mouth daily.   diltiazem (CARDIZEM) 30 MG tablet Take 30 mg by mouth 2 (two) times daily.   esomeprazole  (NEXIUM) 40 MG capsule Take 40 mg by mouth daily at 12 noon.   estradiol (ESTRACE) 2 MG tablet Take 2 mg by mouth daily.   ferrous sulfate 324 (65 Fe) MG TBEC Take 1 tablet by mouth daily.   FLUoxetine (PROZAC) 20 MG capsule Take 20 mg by mouth daily.   furosemide (LASIX) 20 MG tablet Take 20 mg by mouth daily.   isosorbide  mononitrate (IMDUR ) 30 MG 24 hr tablet Take 1 tablet (30 mg total) by mouth daily.   Multiple Vitamin (MULTIVITAMIN) tablet Take 1 tablet by mouth daily.   Nebulizers (COMPRESSOR NEBULIZER) MISC 1 Device by Does not apply route once.   nicotine (NICODERM CQ - DOSED IN MG/24 HOURS) 21 mg/24hr patch Place 21 mg onto the skin daily.   nitroGLYCERIN (NITROSTAT) 0.4 MG SL tablet Place 0.4 mg under the tongue every 5 (five) minutes as needed for chest pain. Reported on 12/14/2015   ondansetron (ZOFRAN-ODT) 8 MG disintegrating tablet Take 8 mg by mouth 2 (two) times daily as needed for nausea or vomiting.   predniSONE (DELTASONE) 20 MG tablet Take 2 tablets (40 mg total) by mouth daily with breakfast for 5 days, THEN 1 tablet (20 mg total) daily with breakfast for 5 days.   sodium chloride  HYPERTONIC 3 % nebulizer solution Take by nebulization 2 (two) times daily as needed for other.   traZODone (DESYREL) 50 MG tablet Take 50 mg by mouth at bedtime as needed for sleep. Take 2 tablets per night as needed     Allergies:   Other, Erythromycin, Gabapentin, Nalbuphine, and Nubain [nalbuphine hcl]   Social History   Socioeconomic History   Marital status: Widowed    Spouse name: Not on file   Number of children: 2   Years of education: Not on file   Highest education level: Not on file  Occupational History   Occupation: disabled  Tobacco Use   Smoking status: Former    Current packs/day: 0.00    Types: Cigarettes    Start date: 09/26/1979    Quit date: 09/25/2009    Years since quitting: 14.8   Smokeless tobacco: Never  Vaping Use   Vaping status: Not on file  Substance and  Sexual Activity   Alcohol use: No    Alcohol/week: 0.0 standard drinks of alcohol    Comment: quit 2005   Drug use: No   Sexual activity: Not on file  Other Topics Concern   Not on file  Social History Narrative   Not on file   Social Drivers of Health   Financial Resource Strain: Not on file  Food Insecurity: Not on file  Transportation Needs: Not on file  Physical Activity: Not on file  Stress: Not on file  Social Connections: Not  on file     Family History: The patient's family history includes Allergies in her brother and mother; Autoimmune disease in her brother; Breast cancer in her cousin and mother; Emphysema in her father; Heart disease in her brother; Rheum arthritis in her maternal grandmother. There is no history of Liver disease, Esophageal cancer, or Colon cancer. ROS:   Please see the history of present illness.    All 14 point review of systems negative except as described per history of present illness  EKGs/Labs/Other Studies Reviewed:    EKG Interpretation Date/Time:  Wednesday August 06 2024 16:18:20 EST Ventricular Rate:  106 PR Interval:  116 QRS Duration:  72 QT Interval:  322 QTC Calculation: 427 R Axis:   88  Text Interpretation: Sinus tachycardia When compared with ECG of 10-Dec-2006 11:00, No significant change was found Confirmed by Bernie Charleston [phone]) on 08/06/2024 4:23:09 PM    Recent Labs: 12/20/2023: ALT 22; TSH 1.94 04/25/2024: BUN 13; Creatinine, Ser 0.61; Hemoglobin 11.5; NT-Pro BNP 256; Platelets 265; Potassium 4.7; Sodium 142  Recent Lipid Panel No results found for: CHOL, TRIG, HDL, CHOLHDL, VLDL, LDLCALC, LDLDIRECT  Physical Exam:    VS:  BP 120/68   Pulse (!) 106   Ht 5' 4 (1.626 m)   Wt 96 lb (43.5 kg)   SpO2 99%   BMI 16.48 kg/m     Wt Readings from Last 3 Encounters:  08/06/24 96 lb (43.5 kg)  04/25/24 94 lb (42.6 kg)  04/16/24 92 lb (41.7 kg)     GEN:  Well nourished, well developed in no  acute distress HEENT: Normal NECK: No JVD; No carotid bruits LYMPHATICS: No lymphadenopathy CARDIAC: RRR, no murmurs, no rubs, no gallops RESPIRATORY:  Clear to auscultation without rales, wheezing or rhonchi  ABDOMEN: Soft, non-tender, non-distended MUSCULOSKELETAL:  No edema; No deformity  SKIN: Warm and dry LOWER EXTREMITIES: no swelling NEUROLOGIC:  Alert and oriented x 3 PSYCHIATRIC:  Normal affect   ASSESSMENT:    1. Coronary artery disease involving native coronary artery of native heart without angina pectoris   2. Cor pulmonale, chronic (HCC)   3. Simple chronic bronchitis (HCC)    PLAN:    In order of problems listed above:  COPD advanced, followed by internal medicine. Chest pain she describes symptoms of chest pain that typically happen at rest also she said when she bent forward and then she gets up she will get some chest pain.  She did try some Mylanta which did not give her any relief.  I will increase dose of Imdur  from 30-60.  I will also discontinue her digoxin . Cor pulmonale compensated stable no swelling of lower extremities. Coronary disease again workup is now on hold continue conservative medical management   Medication Adjustments/Labs and Tests Ordered: Current medicines are reviewed at length with the patient today.  Concerns regarding medicines are outlined above.  Orders Placed This Encounter  Procedures   EKG 12-Lead   Medication changes: No orders of the defined types were placed in this encounter.   Signed, [place] DOROTHA Bernie, MD, [place] 08/06/2024 4:38 PM    [facility]

## 2024-08-06 NOTE — Patient Instructions (Signed)
 Medication Instructions:  Your physician has recommended you make the following change in your medication:   Stop Digoxin   Increase Imdur  to 60 mg daily  *If you need a refill on your cardiac medications before your next appointment, please call your pharmacy*   Lab Work: None ordered If you have labs (blood work) drawn today and your tests are completely normal, you will receive your results only by: MyChart Message (if you have MyChart) OR A paper copy in the mail If you have any lab test that is abnormal or we need to change your treatment, we will call you to review the results.   Testing/Procedures: None ordered   Follow-Up: At [facility], you and your health needs are our priority.  As part of our continuing mission to provide you with exceptional heart care, we have created designated Provider Care Teams.  These Care Teams include your primary Cardiologist (physician) and Advanced Practice Providers (APPs -  Physician Assistants and Nurse Practitioners) who all work together to provide you with the care you need, when you need it.  We recommend signing up for the patient portal called MyChart.  Sign up information is provided on this After Visit Summary.  MyChart is used to connect with patients for Virtual Visits (Telemedicine).  Patients are able to view lab/test results, encounter notes, upcoming appointments, etc.  Non-urgent messages can be sent to your provider as well.   To learn more about what you can do with MyChart, go to [email or web address].    Your next appointment:   2 month(s)  The format for your next appointment:   In Person  Provider:   Lamar Fitch, MD    Other Instructions none  Important Information About Sugar

## 2024-08-12 ENCOUNTER — Telehealth: Payer: Self-pay | Admitting: Cardiology

## 2024-08-12 NOTE — Telephone Encounter (Signed)
 Pt c/o medication issue:  1. Name of Medication: isosorbide  mononitrate (IMDUR ) 60 MG 24 hr tablet   2. How are you currently taking this medication (dosage and times per day)?    3. Are you having a reaction (difficulty breathing--STAT)? no  4. What is your medication issue? Patient is calling in about medication causing her pain in her back and hip. Would like to switch back the other medication. Please advise

## 2024-08-13 NOTE — Telephone Encounter (Signed)
 Patient called and made aware of Dr. Karry recommendation. Patient states she will resume her one time daily Imdur  instead of taking two (states it was doubled at her last appointment). Patient will call back in 1 week to update on how she is feeling.  Alan, RN

## 2024-08-15 ENCOUNTER — Telehealth: Payer: Self-pay | Admitting: Cardiology

## 2024-08-15 NOTE — Telephone Encounter (Signed)
 Patient called stating she might need cataract surgery and she wants to know what Dr. Bernie thinks.  If it would be okay for her to have the surgery.

## 2024-08-15 NOTE — Telephone Encounter (Signed)
 Encouraged pt to have eye doctor send a medical/surgical clearance and Dr. Bernie will address it.

## 2024-08-20 ENCOUNTER — Telehealth: Payer: Self-pay

## 2024-08-20 NOTE — Telephone Encounter (Signed)
 Copied from CRM #[id number]. Topic: Clinical - Medical Advice >> Aug 20, 2024  2:11 PM Isabell A wrote: Reason for CRM: Patient would like to know if she can do a tele visit or what needs to be done so she can get clearance for eye surgery.  Callback number: [phone]  Routing to Surgical clearance.

## 2024-08-25 NOTE — Telephone Encounter (Signed)
 Spoke with the pt and scheduled appt with Dr. Annella for 09/10/24

## 2024-09-01 ENCOUNTER — Telehealth: Payer: Self-pay | Admitting: Cardiology

## 2024-09-01 NOTE — Telephone Encounter (Signed)
 Pt c/o medication issue:  1. Name of Medication:  Digoxin    2. How are you currently taking this medication (dosage and times per day)?   3. Are you having a reaction (difficulty breathing--STAT)?   4. What is your medication issue?   Patient would like to know if she needs to go back on Digoxin  twice daily. Please advise.

## 2024-09-01 NOTE — Telephone Encounter (Signed)
 Left detailed message for pt regarding Dr. Karry recommendation.

## 2024-09-03 NOTE — Telephone Encounter (Signed)
 Recommendations reviewed with pt as per Dr. Karry note.  Pt reports that she is taking 2 Imdur  as advised. Pt verbalized understanding and had no additional questions. Pt states that she is having eye surgery for cataracts.

## 2024-09-03 NOTE — Telephone Encounter (Signed)
 Recommendations reviewed with pt as per Dr. Karry note.  Pt reports that she is taking 2 Imdur  as advised. Pt verbalized understanding and had no additional questions. Pt states that she is having eye surgery for cataracts. Pt also wants to know what to do with diltiazem, does she need to continue it or not. Please advise

## 2024-09-04 NOTE — Telephone Encounter (Signed)
Recommendations reviewed with pt as per Dr. Krasowski's note.  Pt verbalized understanding and had no additional questions.  

## 2024-09-04 NOTE — Telephone Encounter (Signed)
 Left detailed message per DPR as noted by Dr. Krasowski.

## 2024-09-10 ENCOUNTER — Ambulatory Visit: Admitting: Pulmonary Disease

## 2024-09-11 ENCOUNTER — Telehealth: Admitting: Pulmonary Disease

## 2024-09-11 DIAGNOSIS — J4541 Moderate persistent asthma with (acute) exacerbation: Secondary | ICD-10-CM

## 2024-09-11 DIAGNOSIS — J45909 Unspecified asthma, uncomplicated: Secondary | ICD-10-CM | POA: Diagnosis not present

## 2024-09-11 DIAGNOSIS — J441 Chronic obstructive pulmonary disease with (acute) exacerbation: Secondary | ICD-10-CM

## 2024-09-11 DIAGNOSIS — J410 Simple chronic bronchitis: Secondary | ICD-10-CM

## 2024-09-11 MED ORDER — CLINDAMYCIN HCL 150 MG PO CAPS: 150.0000 mg | ORAL_CAPSULE | Freq: Three times a day (TID) | ORAL | 0 refills | Status: AC

## 2024-09-11 MED ORDER — PREDNISONE 20 MG PO TABS: ORAL_TABLET | ORAL | 0 refills | Status: AC

## 2024-09-11 MED ORDER — ACETYLCYSTEINE 10 % IN SOLN: 4.0000 mL | Freq: Two times a day (BID) | RESPIRATORY_TRACT | 6 refills | Status: AC | PRN

## 2024-09-11 NOTE — Telephone Encounter (Signed)
 Yes but told me she did not need clearance.

## 2024-09-11 NOTE — Telephone Encounter (Signed)
 Ov with [facility] 09/10/24 was cancelled and instead she had televisit done for acute symptoms 09/11/24.   Dr Annella, did she mention her eye surgery to you? I see that she was advised to f/u here in 6 months.

## 2024-09-11 NOTE — Progress Notes (Signed)
 @Patient  ID: Breanna Hughes, female    DOB: May 12, 1956, 68 y.o.   MRN: [id number]  No chief complaint on file.   Referring provider: Jama Chow, MD  HPI:   68 y.o. woman whom are seeing for evaluation of COPD.  Most recent cardiology note reviewed.    Upcoming cataract surgery.  Thought she would have need for clearance.  This was not requested.  That she made the appointment.  She has had some increased cough and congestion last few days.  Feeling croupy.  Stable oxygen.  Breathing okay so far.  Maybe a little labored.  Request antibiotic and steroid.  She request Mucomyst  be refilled, helps tremendously with getting mucus out of her lungs and keeping her lungs healthy.  Attempted to connect with the patient via video visit.  Unfortunately due to technical difficulties, she was unable to connect effectively.  The remaining portion of the visit was carried out via telephone.  HPI at initial visit: Patient with COPD.  Present for many years.  Last seen in pulmonary office I think in 2018.  Has been off maintenance inhaler for some time now.  Has baseline dyspnea that occasionally worsens.  But she has managed for many years with use of as needed albuterol  via HFA and nebulizer.  Unfortunately things worsened over the last several months.  Sounds like viral infection.  More symptomatic.  Been using DuoNebs and Combivent now for rescue.  Using multiple times a day.  Has not been restarted on a maintenance inhaler.  She reports multiple episodes of worsening breathing throughout the year requiring prednisone .  Prednisone  reliably improves things.  She had PFTs in 2015 fully interpreted below but these are consistent with moderate COPD.  Most recent CT chest 2018 personally reviewed with impressive and significant emphysema throughout.  Questionaires / Pulmonary Flowsheets:   ACT:      No data to display          MMRC:     No data to display          Epworth:      No data to  display          Tests:   FENO:  No results found for: NITRICOXIDE  PFT:    Latest Ref Rng & Units 04/07/2014    2:11 PM  PFT Results  FVC-Pre L 3.08   FVC-Predicted Pre % 91   FVC-Post L 3.10   FVC-Predicted Post % 91   Pre FEV1/FVC % % 50   Post FEV1/FCV % % 49   FEV1-Pre L 1.54   FEV1-Predicted Pre % 58   FEV1-Post L 1.52   DLCO uncorrected ml/min/mmHg 12.98   DLCO UNC% % 53   DLVA Predicted % 53   TLC L 5.73   TLC % Predicted % 113   RV % Predicted % 131   Personally viewed interpreted as moderate COPD, lung volumes with air trapping, DLCO moderately reduced  WALK:     04/07/2014    8:03 PM 04/07/2014    3:10 PM  SIX MIN WALK  Medications  Norco 7.5/500mg  and Fentanyl patch @ 10:30am  Supplimental Oxygen during Test? (L/min)  No  Laps  8   Partial Lap (in Meters)  34.5 meters  Baseline BP (sitting)  112/68  Baseline Heartrate  92  Baseline Dyspnea (Borg Scale)  1  Baseline Fatigue (Borg Scale)  4  Baseline SPO2  94 %  BP (sitting)  120/64  Heartrate  93  Dyspnea (Borg Scale)  4  Fatigue (Borg Scale)  7  SPO2  89 %  BP (sitting)  104/70  Heartrate  93  SPO2  96 %  Stopped or Paused before Six Minutes  No  Interpretation  Hip pain;Leg pain;Calf pain  Distance Completed  418.5 meters   Tech Comments:  pt c/o increased SOB at end of test--89% O2. Pt c/o calf pain.leg pain, back pain and hip pain during test. Pt wore brace on R leg during test. -- [facility]  Provider Comments: 94%, 89%, 96%, 418.5 m with complaint of leg, back and hip pains related to known arthritis. No oxygen limitation on this test      Data saved with a previous flowsheet row definition    Imaging: Personally reviewed as per EMR and discussion in this note No results found.  Lab Results: Personally reviewed CBC    Component Value Date/Time   WBC 6.1 04/25/2024 0834   WBC 7.8 12/20/2023 1533   RBC 3.91 04/25/2024 0834   RBC 4.46 12/20/2023 1533   HGB 11.5 04/25/2024 0834   HCT  38.3 04/25/2024 0834   PLT 265 04/25/2024 0834   MCV 98 (H) 04/25/2024 0834   MCH 29.4 04/25/2024 0834   MCHC 30.0 (L) 04/25/2024 0834   MCHC 33.1 12/20/2023 1533   RDW 12.7 04/25/2024 0834   LYMPHSABS 1.4 12/20/2023 1533   MONOABS 0.7 12/20/2023 1533   EOSABS 0.1 12/20/2023 1533   BASOSABS 0.0 12/20/2023 1533    BMET    Component Value Date/Time   NA 142 04/25/2024 0834   K 4.7 04/25/2024 0834   CL 96 04/25/2024 0834   CO2 36 (H) 04/25/2024 0834   GLUCOSE 98 04/25/2024 0834   GLUCOSE 104 (H) 12/20/2023 1533   BUN 13 04/25/2024 0834   CREATININE 0.61 04/25/2024 0834   CALCIUM 9.0 04/25/2024 0834   GFRNONAA >60 05/01/2007 0756   GFRAA  05/01/2007 0756    >60        The eGFR has been calculated using the MDRD equation. This calculation has not been validated in all clinical    BNP No results found for: BNP  ProBNP    Component Value Date/Time   PROBNP 256 04/25/2024 0834    Specialty Problems       Pulmonary Problems   COPD mixed type (HCC)   6 MWT 04/07/14- 94%, 89%, 96%, 418.5 m with complaint of leg, back and hip pains related to known arthritis. No oxygen limitation on this test PFT 04/07/14- moderately severe obstructive airways disease-emphysema, moderately severe diffusion, insignificant response to bronchodilator. FVC 3.10/91%, FEV1 1.52/58%, FEV1/FVC 0.49, FEF 25-75% 0.66, TLC 113%, RV 131%, DLCO 53%.       Active asthma   Allergic rhinitis   Chronic frontal sinusitis   Chronic maxillary sinusitis   Cough   Asthma   COPD (chronic obstructive pulmonary disease) (HCC)   Pneumonia    Allergies  Allergen Reactions   Other Other (See Comments)    Darvocet-deathly sick   Erythromycin     Anaphylaxis (disorder)   Gabapentin     restless leg   Nalbuphine Other (See Comments)   Nubain [Nalbuphine Hcl]     Immunization History  Administered Date(s) Administered   Influenza Split 06/25/2013, 06/25/2014, 07/07/2015, 06/25/2016    Influenza-Unspecified 06/25/2014   Pneumococcal Polysaccharide-23 09/25/2008, 02/19/2014    Past Medical History:  Diagnosis Date   Active asthma 11/02/2015   Allergic rhinitis 11/02/2015   Aneurysm of middle  cerebral artery 11/02/2015   Arthritis    Asthma    Atypical chest pain 11/13/2014   CAD (coronary artery disease)    Chronic frontal sinusitis 11/02/2015   Chronic maxillary sinusitis 11/02/2015   Colon polyps    COPD (chronic obstructive pulmonary disease) (HCC)    COPD mixed type (HCC) 04/07/2014   6 MWT 04/07/14- 94%, 89%, 96%, 418.5 m with complaint of leg, back and hip pains related to known arthritis. No oxygen limitation on this test  PFT 04/07/14- moderately severe obstructive airways disease-emphysema, moderately severe diffusion, insignificant response to bronchodilator. FVC 3.10/91%, FEV1 1.52/58%, FEV1/FVC 0.49, FEF 25-75% 0.66, TLC 113%, RV 131%, DLCO 53%.        Cor pulmonale, chronic (HCC) 03/12/2024   Cough 11/02/2015   Diarrhea 12/23/2015   Drug therapy 11/02/2015   Edema    Epigastric pain 12/23/2015   Esophageal reflux    Fibromyalgia    Former smoker 11/02/2015   Generalized osteoarthrosis, involving multiple sites    H/O ulcer disease 11/02/2015   Hearing loss 11/02/2015   Hypertonicity of bladder    Insomnia, unspecified    Irritable bowel syndrome    Knee pain, bilateral 11/02/2015   Localized edema 11/02/2015   Loss of weight    Myalgia 11/02/2015   Myositis 11/02/2015   Nausea with vomiting 12/23/2015   Pain in knee joint 11/02/2015   Peripheral neuropathy 11/02/2015   Pneumonia    Proteinuria 11/02/2015   Swelling of limb 11/02/2015   Symptomatic menopausal or female climacteric states    Systemic lupus (HCC) 04/07/2014   Details not available     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   acetylcysteine  (MUCOMYST ) 10 % nebulizer solution Take 4 mLs by nebulization 2 (two) times daily as needed.   ALPRAZolam (XANAX) 0.25 MG tablet Take 0.25 mg by mouth every 6 (six) hours as needed for anxiety.   aspirin EC 81 MG tablet Take 81 mg by mouth daily. Swallow whole.   atorvastatin (LIPITOR) 40 MG tablet Take 40 mg by mouth daily.   budesonide  (PULMICORT ) 0.25 MG/2ML nebulizer solution Take 2 mLs (0.25 mg total) by nebulization 2 (two) times daily.   buprenorphine (SUBUTEX) 8 MG SUBL SL tablet Place 24 mg under the tongue daily as needed (OD).   clindamycin  (CLEOCIN ) 150 MG capsule Take 1 capsule (150 mg total) by mouth 3 (three) times daily for 10 days.   COMBIVENT RESPIMAT 20-100 MCG/ACT AERS respimat Inhale 1 puff into the lungs every 6 (six) hours as needed for wheezing or shortness of breath.   diltiazem (CARDIZEM) 30 MG tablet Take 30 mg by mouth 2 (two) times daily.   esomeprazole (NEXIUM) 40 MG capsule Take 40 mg by mouth daily at 12 noon.   estradiol (ESTRACE) 2 MG tablet Take 2 mg by mouth daily.   ferrous sulfate 324 (65 Fe) MG TBEC Take 1 tablet  by mouth daily.   FLUoxetine (PROZAC) 20 MG capsule Take 20 mg by mouth daily.   furosemide (LASIX) 20 MG tablet Take 20 mg by mouth daily.   isosorbide  mononitrate (IMDUR ) 60 MG 24 hr tablet Take 1 tablet (60 mg total) by mouth daily.   Multiple Vitamin (MULTIVITAMIN) tablet Take 1 tablet by mouth daily.   Nebulizers (COMPRESSOR NEBULIZER) MISC 1 Device by Does not apply route once.   nicotine (NICODERM CQ - DOSED IN MG/24 HOURS) 21 mg/24hr patch  Place 21 mg onto the skin daily.   nitroGLYCERIN (NITROSTAT) 0.4 MG SL tablet Place 0.4 mg under the tongue every 5 (five) minutes as needed for chest pain. Reported on 12/14/2015   ondansetron (ZOFRAN-ODT) 8 MG disintegrating tablet Take 8 mg by mouth 2 (two) times daily as needed for nausea or vomiting.   sodium chloride  HYPERTONIC 3 % nebulizer solution Take by nebulization 2 (two) times daily as needed for other.   traZODone (DESYREL) 50 MG tablet Take 50 mg by mouth at bedtime as needed for sleep. Take 2 tablets per night as needed   [DISCONTINUED] acetylcysteine  (MUCOMYST ) 10 % nebulizer solution Take 4 mLs by nebulization 2 (two) times daily as needed.   [DISCONTINUED] clindamycin  (CLEOCIN ) 150 MG capsule Take 150 mg by mouth 3 (three) times daily.   No facility-administered encounter medications on file as of 09/11/2024.     Review of Systems  Review of Systems  N/a Physical Exam  There were no vitals taken for this visit.  Wt Readings from Last 5 Encounters:  08/06/24 96 lb (43.5 kg)  04/25/24 94 lb (42.6 kg)  04/16/24 92 lb (41.7 kg)  03/12/24 91 lb (41.3 kg)  01/07/24 84 lb (38.1 kg)    BMI Readings from Last 5 Encounters:  08/06/24 16.48 kg/m  04/25/24 16.14 kg/m  04/16/24 15.79 kg/m  03/12/24 15.62 kg/m  01/07/24 14.42 kg/m     Physical Exam Pulmonary: Normal work of breathing, speaking in full sentences, no conversational dyspnea  Assessment & Plan:   COPD with exacerbation: Couple weeks of increased cough congestion shortness of breath.   Continue Breztri , continue short acting bronchodilators as needed, continue budesonide  nebulizer at night. Prednisone  taper, clindamycin  prescribed for exacerbation.   Chronic bronchitis: Requiring significant airway clearance.  Continue saline as needed.  Mucomyst  refilled today.   Return in about 6 months (around 03/12/2025) for f/u Dr. Annella.   Donnice JONELLE Annella, MD 09/11/2024  Virtual Visit via Video  Note  I connected with Breanna Hughes on 09/11/2024 at 10:00 AM EST by a video enabled telemedicine application and verified that I am speaking with the correct person using two identifiers.  Location: Patient: Home Provider: Office - [facility] - [address] [place], [address], [place], [place] [address]  I discussed the limitations of evaluation and management by telemedicine and the availability of in person appointments. The patient expressed understanding and agreed to proceed. I also discussed with the patient that there may be a patient responsible charge related to this service. The patient expressed understanding and agreed to proceed.  Patient consented to consult via telephone: Yes People present and their role in pt care: Pt

## 2024-09-12 ENCOUNTER — Telehealth: Payer: Self-pay

## 2024-09-12 MED ORDER — DIGOXIN 125 MCG PO TABS: 0.1250 mg | ORAL_TABLET | Freq: Every day | ORAL | Status: AC

## 2024-09-12 NOTE — Telephone Encounter (Signed)
 Pt stated that she could not take the diltiazem. She stated that it caused diarrhea. She stated that she is going back to Imdur  60mg  daily and Digoxin  0.125mg  daily

## 2024-09-12 NOTE — Telephone Encounter (Signed)
 Pt c/o medication issue:  1. Name of Medication: diltiazem   2. How are you currently taking this medication (dosage and times per day)? As written   3. Are you having a reaction (difficulty breathing--STAT)? No   4. What is your medication issue? Pt states this medications causes stomach problems.  Pt called to see if going back to digoxin  and Imdur  would be ok please advise

## 2024-10-08 ENCOUNTER — Ambulatory Visit: Attending: Cardiology | Admitting: Cardiology

## 2024-10-08 ENCOUNTER — Encounter: Payer: Self-pay | Admitting: Cardiology

## 2024-10-08 VITALS — BP 150/76 | HR 104 | Ht 64.0 in | Wt 104.0 lb

## 2024-10-08 DIAGNOSIS — J410 Simple chronic bronchitis: Secondary | ICD-10-CM

## 2024-10-08 DIAGNOSIS — R079 Chest pain, unspecified: Secondary | ICD-10-CM

## 2024-10-08 DIAGNOSIS — I2781 Cor pulmonale (chronic): Secondary | ICD-10-CM | POA: Diagnosis not present

## 2024-10-08 DIAGNOSIS — I2510 Atherosclerotic heart disease of native coronary artery without angina pectoris: Secondary | ICD-10-CM

## 2024-10-08 NOTE — Addendum Note (Signed)
 Addended by: ARLOA MALLORY D on: 10/08/2024 03:46 PM   Modules accepted: Orders

## 2024-10-08 NOTE — Progress Notes (Signed)
 " Cardiology Office Note:    Date:  10/08/2024   ID:  Breanna Hughes, DOB 04-28-1956, MRN [id number]  PCP:  Jama Chow, MD  Cardiologist:  Lamar Fitch, MD    Referring MD: Jama Chow, MD   Chief Complaint  Patient presents with   Follow-up  I had coughing spell and Dr. Dorien office  History of Present Illness:    Breanna Hughes is a 69 y.o. female past medical history significant for advanced COPD she does have cachectic look and she is using oxygen all the time, call pulmonary, swelling of lower extremities now under control.  She does have some chest pain which is difficult to figure out overall or poor condition we decided to proceed with a conservative approach.  She describes situation she was in her primary care physician office week ago exactly and then at the end of the visit she was getting ready to go home started having coughing spells she still feel exactly same way when she ended up going to hospital last time and she was found to have normal troponin.  Entire sensation lasted for about half an hour.  She also described to have some back pain that happens sometimes at night.  Overall symptoms are somewhat nonspecific for cardiac events.  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 No oxygen limitation on this test  PFT 04/07/14- moderately severe obstructive airways disease-emphysema, moderately severe diffusion, insignificant response to bronchodilator. FVC 3.10/91%, FEV1 1.52/58%, FEV1/FVC  0.49, FEF 25-75% 0.66, TLC 113%, RV 131%, DLCO 53%.        Cor pulmonale, chronic (HCC) 03/12/2024   Cough 11/02/2015   Diarrhea 12/23/2015   Drug therapy 11/02/2015   Edema    Epigastric pain 12/23/2015   Esophageal reflux    Fibromyalgia    Former smoker 11/02/2015   Generalized osteoarthrosis, involving multiple sites    H/O ulcer disease 11/02/2015   Hearing loss 11/02/2015   Hypertonicity of bladder    Insomnia, unspecified    Irritable bowel syndrome    Knee pain, bilateral 11/02/2015   Localized edema 11/02/2015   Loss of weight    Myalgia 11/02/2015   Myositis 11/02/2015   Nausea with vomiting 12/23/2015   Pain in knee joint 11/02/2015   Peripheral neuropathy 11/02/2015   Pneumonia    Proteinuria 11/02/2015   Swelling of limb 11/02/2015   Symptomatic menopausal or female climacteric states    Systemic lupus (HCC) 04/07/2014   Details not available     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   Ulcer of nose 11/02/2015    Past Surgical History:  Procedure Laterality Date   ABDOMINAL HYSTERECTOMY  01/24/2016   APPENDECTOMY Left 1976   BREAST LUMPECTOMY     x 2   CEREBRAL ANEURYSM REPAIR     LUMBAR DISC SURGERY     x 3    Current Medications: Active Medications[1]   Allergies:   Other, Erythromycin, Gabapentin, Nalbuphine, and Nubain [nalbuphine  hcl]   Social History   Socioeconomic History   Marital status: Widowed    Spouse name: Not on file   Number of children: 2   Years of education: Not on file   Highest education level: Not on file  Occupational History   Occupation: disabled  Tobacco Use   Smoking status: Former    Current packs/day: 0.00    Types: Cigarettes    Start date: 09/26/1979    Quit date: 09/25/2009    Years since quitting: 15.0   Smokeless tobacco: Never  Vaping Use   Vaping status: Not on file  Substance and Sexual Activity   Alcohol use: No    Alcohol/week: 0.0 standard drinks of alcohol    Comment: quit 2005   Drug use: No   Sexual  activity: Not on file  Other Topics Concern   Not on file  Social History Narrative   Not on file   Social Drivers of Health   Tobacco Use: Medium Risk (10/08/2024)   Patient History    Smoking Tobacco Use: Former    Smokeless Tobacco Use: Never    Passive Exposure: Not on Actuary Strain: Not on file  Food Insecurity: Not on file  Transportation Needs: Not on file  Physical Activity: Not on file  Stress: Not on file  Social Connections: Not on file  Depression (EYV7-0): Not on file  Alcohol Screen: Not on file  Housing: Not on file  Utilities: Not on file  Health Literacy: Not on file     Family History: The patient's family history includes Allergies in her brother and mother; Autoimmune disease in her brother; Breast cancer in her cousin and mother; Emphysema in her father; Heart disease in her brother; Rheum arthritis in her maternal grandmother. There is no history of Liver disease, Esophageal cancer, or Colon cancer. ROS:   Please see the history of present illness.    All 14 point review of systems negative except as described per history of present illness  EKGs/Labs/Other Studies Reviewed:         Recent Labs: 12/20/2023: ALT 22; TSH 1.94 04/25/2024: BUN 13; Creatinine, Ser 0.61; Hemoglobin 11.5; NT-Pro BNP 256; Platelets 265; Potassium 4.7; Sodium 142  Recent Lipid Panel No results found for: CHOL, TRIG, HDL, CHOLHDL, VLDL, LDLCALC, LDLDIRECT  Physical Exam:    VS:  BP (!) 150/76   Pulse (!) 104   Ht 5' 4 (1.626 m)   Wt 104 lb (47.2 kg)   SpO2 98%   BMI 17.85 kg/m     Wt Readings from Last 3 Encounters:  10/08/24 104 lb (47.2 kg)  08/06/24 96 lb (43.5 kg)  04/25/24 94 lb (42.6 kg)     GEN:  Well nourished, well developed in no acute distress HEENT: Normal NECK: No JVD; No carotid bruits LYMPHATICS: No lymphadenopathy CARDIAC: RRR, no murmurs, no rubs, no gallops RESPIRATORY:  Clear to auscultation without rales,  wheezing or rhonchi  ABDOMEN: Soft, non-tender, non-distended MUSCULOSKELETAL:  No edema; No deformity  SKIN: Warm and dry LOWER EXTREMITIES: no swelling NEUROLOGIC:  Alert and oriented x 3 PSYCHIATRIC:  Normal affect   ASSESSMENT:    1. Coronary artery disease involving native coronary artery of native heart without angina pectoris   2. Cor pulmonale, chronic (HCC)   3. Simple chronic bronchitis (HCC)    PLAN:    In order of problems listed above:  Chest pain with some atypical characteristic I will ask her to do  troponin I to see if anything happened last week.  Again because overall her condition I would favor conservative approach.  She said that she had some back pain she take some nitroglycerin ibuprofen and aspirin that seems to be helping. Cor pulmonale stable.  No significant swelling of lower extremities. COPD advancing oxygen.  Interestingly she just had cataract surgery in both eyes.  However she cannot tell if she is doing significantly better   Medication Adjustments/Labs and Tests Ordered: Current medicines are reviewed at length with the patient today.  Concerns regarding medicines are outlined above.  No orders of the defined types were placed in this encounter.  Medication changes: No orders of the defined types were placed in this encounter.   Signed, Lamar DOROTHA Fitch, MD, [facility] 10/08/2024 3:34 PM    [facility]    [1]  Current Meds  Medication Sig   acetylcysteine  (MUCOMYST ) 10 % nebulizer solution Take 4 mLs by nebulization 2 (two) times daily as needed.   ALPRAZolam (XANAX) 0.25 MG tablet Take 0.25 mg by mouth every 6 (six) hours as needed for anxiety.   aspirin EC 81 MG tablet Take 81 mg by mouth daily. Swallow whole.   atorvastatin (LIPITOR) 40 MG tablet Take 40 mg by mouth daily.   budesonide  (PULMICORT ) 0.25 MG/2ML nebulizer solution Take 2 mLs (0.25 mg total) by nebulization 2 (two) times daily.   buprenorphine (SUBUTEX)  8 MG SUBL SL tablet Place 24 mg under the tongue daily as needed (OD).   COMBIVENT RESPIMAT 20-100 MCG/ACT AERS respimat Inhale 1 puff into the lungs every 6 (six) hours as needed for wheezing or shortness of breath.   Difluprednate 0.05 % EMUL Place 1 drop into the left eye daily.   digoxin  (LANOXIN ) 0.125 MG tablet Take 1 tablet (0.125 mg total) by mouth daily.   esomeprazole (NEXIUM) 40 MG capsule Take 40 mg by mouth daily at 12 noon.   estradiol (ESTRACE) 2 MG tablet Take 2 mg by mouth daily.   ferrous sulfate 324 (65 Fe) MG TBEC Take 1 tablet by mouth daily.   FLUoxetine (PROZAC) 20 MG capsule Take 20 mg by mouth daily.   furosemide (LASIX) 20 MG tablet Take 20 mg by mouth daily.   isosorbide  mononitrate (IMDUR ) 60 MG 24 hr tablet Take 1 tablet (60 mg total) by mouth daily.   Multiple Vitamin (MULTIVITAMIN) tablet Take 1 tablet by mouth daily.   Nebulizers (COMPRESSOR NEBULIZER) MISC 1 Device by Does not apply route once.   nicotine (NICODERM CQ - DOSED IN MG/24 HOURS) 21 mg/24hr patch Place 21 mg onto the skin daily.   nitroGLYCERIN (NITROSTAT) 0.4 MG SL tablet Place 0.4 mg under the tongue every 5 (five) minutes as needed for chest pain. Reported on 12/14/2015   ofloxacin (OCUFLOX) 0.3 % ophthalmic solution Place 1 drop into both eyes 4 (four) times daily.   ondansetron (ZOFRAN-ODT) 8 MG disintegrating tablet Take 8 mg by mouth 2 (two) times daily as needed for nausea or vomiting.   sodium chloride  HYPERTONIC 3 % nebulizer solution Take by nebulization 2 (two) times daily as needed for other.   traZODone (DESYREL) 50 MG tablet Take 50 mg by mouth at bedtime as needed for sleep. Take 2 tablets per night as needed   "

## 2024-10-08 NOTE — Patient Instructions (Signed)
 Medication Instructions:  Your physician recommends that you continue on your current medications as directed. Please refer to the Current Medication list given to you today.  *If you need a refill on your cardiac medications before your next appointment, please call your pharmacy*   Lab Work: Troponin- today If you have labs (blood work) drawn today and your tests are completely normal, you will receive your results only by: MyChart Message (if you have MyChart) OR A paper copy in the mail If you have any lab test that is abnormal or we need to change your treatment, we will call you to review the results.   Testing/Procedures: None Ordered   Follow-Up: At [facility], you and your health needs are our priority.  As part of our continuing mission to provide you with exceptional heart care, we have created designated Provider Care Teams.  These Care Teams include your primary Cardiologist (physician) and Advanced Practice Providers (APPs -  Physician Assistants and Nurse Practitioners) who all work together to provide you with the care you need, when you need it.  We recommend signing up for the patient portal called MyChart.  Sign up information is provided on this After Visit Summary.  MyChart is used to connect with patients for Virtual Visits (Telemedicine).  Patients are able to view lab/test results, encounter notes, upcoming appointments, etc.  Non-urgent messages can be sent to your provider as well.   To learn more about what you can do with MyChart, go to [email or web address].    Your next appointment:   2 month(s)  The format for your next appointment:   In Person  Provider:   Lamar Fitch, MD    Other Instructions NA

## 2024-10-09 LAB — TROPONIN T: Troponin T (Highly Sensitive): 12 ng/L (ref 0–14)

## 2024-10-13 ENCOUNTER — Telehealth: Payer: Self-pay | Admitting: Cardiology

## 2024-10-13 ENCOUNTER — Ambulatory Visit: Payer: Self-pay | Admitting: Cardiology

## 2024-10-13 NOTE — Telephone Encounter (Signed)
" ° °  Pt c/o of Chest Pain: STAT if active CP, including tightness, pressure, jaw pain, radiating pain to shoulder/upper arm/back, CP unrelieved by Nitro. Symptoms reported of SOB, nausea, vomiting, sweating.  1. Are you having CP right now? No    2. Are you experiencing any other symptoms (ex. SOB, nausea, vomiting, sweating)? Nausea   3. Is your CP continuous or coming and going? During the episode it was coming and going for 20 minutes    4. Have you taken Nitroglycerin? Yes    5. How long have you been experiencing CP? Patient said intense chest pain lasted for 20 minutes and she could not move     6. If NO CP at time of call then end call with telling Pt to call back or call 911 if Chest pain returns prior to return call from triage team.  "

## 2024-10-13 NOTE — Telephone Encounter (Signed)
Troponin Results reviewed with pt as per Dr. Vanetta Shawl note.  Pt verbalized understanding and had no additional questions. Routed to PCP

## 2024-10-13 NOTE — Telephone Encounter (Signed)
 Pt called stated that she had a spell last week of chest pain and had to take 3 nitroglycerin before it subsided. Encouraged pt to go to the ER if she has chest pain and has to take a 3rd nitroglycerin. Pt verbalized understanding and had no further questions.

## 2024-10-13 NOTE — Telephone Encounter (Signed)
 Patient is requesting for call to discuss results for Troponin T

## 2024-10-14 ENCOUNTER — Telehealth: Payer: Self-pay

## 2024-10-14 NOTE — Telephone Encounter (Signed)
 Lab Results reviewed with pt as per Dr. Karry note.  Pt verbalized understanding and had no additional questions. Routed to PCP

## 2024-12-11 ENCOUNTER — Ambulatory Visit: Admitting: Cardiology
